# Patient Record
Sex: Male | Born: 1990 | Race: Black or African American | Hispanic: No | Marital: Single | Smoking: Never smoker
Health system: Southern US, Community
[De-identification: ages and names within clinical notes are randomized; demographics above are authoritative.]

---
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
## 2009-10-04 ENCOUNTER — Emergency Department (HOSPITAL_COMMUNITY): Admission: EM | Admit: 2009-10-04 | Discharge: 2009-10-04 | Payer: Self-pay | Admitting: Emergency Medicine

## 2010-02-28 ENCOUNTER — Emergency Department (HOSPITAL_COMMUNITY): Admission: EM | Admit: 2010-02-28 | Discharge: 2010-02-28 | Payer: Self-pay | Admitting: Emergency Medicine

## 2010-07-16 ENCOUNTER — Emergency Department (HOSPITAL_COMMUNITY): Admission: EM | Admit: 2010-07-16 | Discharge: 2010-07-16 | Payer: Self-pay | Admitting: Emergency Medicine

## 2011-02-22 LAB — RAPID STREP SCREEN (MED CTR MEBANE ONLY): Streptococcus, Group A Screen (Direct): NEGATIVE

## 2011-12-25 ENCOUNTER — Encounter (HOSPITAL_COMMUNITY): Payer: Self-pay | Admitting: *Deleted

## 2011-12-25 ENCOUNTER — Emergency Department (HOSPITAL_COMMUNITY)
Admission: EM | Admit: 2011-12-25 | Discharge: 2011-12-25 | Disposition: A | Discharge status: Home / Self Care | Payer: Self-pay | Attending: Emergency Medicine | Admitting: Emergency Medicine

## 2011-12-25 DIAGNOSIS — K5289 Other specified noninfective gastroenteritis and colitis: Secondary | ICD-10-CM | POA: Insufficient documentation

## 2011-12-25 DIAGNOSIS — R109 Unspecified abdominal pain: Secondary | ICD-10-CM | POA: Insufficient documentation

## 2011-12-25 DIAGNOSIS — R112 Nausea with vomiting, unspecified: Secondary | ICD-10-CM | POA: Insufficient documentation

## 2011-12-25 DIAGNOSIS — R197 Diarrhea, unspecified: Secondary | ICD-10-CM | POA: Insufficient documentation

## 2011-12-25 DIAGNOSIS — K529 Noninfective gastroenteritis and colitis, unspecified: Secondary | ICD-10-CM

## 2011-12-25 MED ORDER — DIPHENOXYLATE-ATROPINE 2.5-0.025 MG PO TABS: 1.0000 | ORAL_TABLET | Freq: Four times a day (QID) | ORAL | Status: AC | PRN

## 2011-12-25 MED ORDER — DIPHENOXYLATE-ATROPINE 2.5-0.025 MG PO TABS
2.0000 | ORAL_TABLET | Freq: Once | ORAL | Status: AC
  Administered 2011-12-25: 2 via ORAL
  Filled 2011-12-25: qty 2

## 2011-12-25 NOTE — ED Provider Notes (Signed)
History     CSN: [id number]  Arrival date & time 12/25/11  0341   First MD Initiated Contact with Patient 12/25/11 0400      Chief Complaint  Patient presents with  . GI Problem    (Consider location/radiation/quality/duration/timing/severity/associated sxs/prior treatment) HPI This is a 21 year old black male the history of nausea, vomiting and diarrhea that began yesterday. He states all of his friends have a similar illness. This is been accompanied by mild to moderate abdominal cramping. The nausea and vomiting have resolved and is now able to drink fluids without emesis. He states the diarrhea continues. He denies being lightheaded. He denies fever. He has not taken any medications for this.  History reviewed. No pertinent past medical history.  History reviewed. No pertinent past surgical history.  History reviewed. No pertinent family history.  History  Substance Use Topics  . Smoking status: Never Smoker   . Smokeless tobacco: Not on file  . Alcohol Use: No      Review of Systems  All other systems reviewed and are negative.    Allergies  Review of patient's allergies indicates no known allergies.  Home Medications  No current outpatient prescriptions on file.  BP 165/91  Pulse 68  Temp 98.2 F (36.8 C)  Resp 16  Ht 5\' 11"  (1.803 m)  Wt 270 lb (122.471 kg)  BMI 37.66 kg/m2  SpO2 98%  Physical Exam General: Well-developed, well-nourished male in no acute distress; appearance consistent with age of record HENT: normocephalic, atraumatic Eyes: pupils equal round and reactive to light; extraocular muscles intact Neck: supple Heart: regular rate and rhythm Lungs: clear to auscultation bilaterally Abdomen: soft; nondistended; slight diffuse tenderness; bowel sounds normal Extremities: No deformity; full range of motion; pulses normal Neurologic: Awake, alert and oriented; motor function intact in all extremities and symmetric; no facial droop Skin:  Warm and dry Psychiatric: Normal mood and affect    ED Course  Procedures (including critical care time)     MDM          Hanley Seamen, MD 12/25/11 0406

## 2011-12-25 NOTE — ED Notes (Signed)
Pt reporting generalized abdominal pain that began yesterday.  Reports nausea and vomiting x2. Reports some loose stools.  States he has been able to tolerate p.o intake and has been drinking Gatorade.

## 2012-05-27 ENCOUNTER — Encounter (HOSPITAL_COMMUNITY): Payer: Self-pay | Admitting: Emergency Medicine

## 2012-05-27 ENCOUNTER — Emergency Department (HOSPITAL_COMMUNITY)
Admission: EM | Admit: 2012-05-27 | Discharge: 2012-05-27 | Disposition: A | Discharge status: Home / Self Care | Payer: Self-pay | Attending: Emergency Medicine | Admitting: Emergency Medicine

## 2012-05-27 DIAGNOSIS — R112 Nausea with vomiting, unspecified: Secondary | ICD-10-CM | POA: Insufficient documentation

## 2012-05-27 DIAGNOSIS — I10 Essential (primary) hypertension: Secondary | ICD-10-CM | POA: Insufficient documentation

## 2012-05-27 NOTE — ED Notes (Signed)
Patient states he was at work and bought a honey bun from [employer] and when he bit into it there were "bugs and gnats in it." Patient states he vomited afterward.

## 2012-05-27 NOTE — Discharge Instructions (Signed)
Return to the emergency department if you're having any problems.  Your blood pressure was elevated today. He need to have the blood pressure rechecked in the next several days. If it is persistently elevated, he will need to start treatment to bring it down. It may be elevated just based on the stress of the incident tonight. Please make sure that she have her blood pressure checked again in the next week.  Hypertension Information As your heart beats, it forces blood through your arteries. This force is your blood pressure. If the pressure is too high, it is called hypertension (HTN) or high blood pressure. HTN is dangerous because you may have it and not know it. High blood pressure may mean that your heart has to work harder to pump blood. Your arteries may be narrow or stiff. The extra work puts you at risk for heart disease, stroke, and other problems.  Blood pressure consists of two numbers, a higher number over a lower, 110/72, for example. It is stated as "110 over 72." The ideal is below 120 for the top number (systolic) and under 80 for the bottom (diastolic).  You should pay close attention to your blood pressure if you have certain conditions such as:  Heart failure.   Prior heart attack.   Diabetes   Chronic kidney disease.   Prior stroke.   Multiple risk factors for heart disease.  To see if you have HTN, your blood pressure should be measured while you are seated with your arm held at the level of the heart. It should be measured at least twice. A one-time elevated blood pressure reading (especially in the Emergency Department) does not mean that you need treatment. There may be conditions in which the blood pressure is different between your right and left arms. It is important to see your caregiver soon for a recheck. Most people have essential hypertension which means that there is not a specific cause. This type of high blood pressure may be lowered by changing lifestyle  factors such as:  Stress.   Smoking.   Lack of exercise.   Excessive weight.   Drug/tobacco/alcohol use.   Eating less salt.  Most people do not have symptoms from high blood pressure until it has caused damage to the body. Effective treatment can often prevent, delay or reduce that damage. TREATMENT  Treatment for high blood pressure, when a cause has been identified, is directed at the cause. There are a large number of medications to treat HTN. These fall into several categories, and your caregiver will help you select the medicines that are best for you. Medications may have side effects. You should review side effects with your caregiver. If your blood pressure stays high after you have made lifestyle changes or started on medicines,   Your medication(s) may need to be changed.   Other problems may need to be addressed.   Be certain you understand your prescriptions, and know how and when to take your medicine.   Be sure to follow up with your caregiver within the time frame advised (usually within two weeks) to have your blood pressure rechecked and to review your medications.   If you are taking more than one medicine to lower your blood pressure, make sure you know how and at what times they should be taken. Taking two medicines at the same time can result in blood pressure that is too low.  Document Released: 01/28/2006 Document Revised: 08/07/2011 Document Reviewed: 02/04/2008 Exit[employer] Patient Information  [address] [place].

## 2012-05-27 NOTE — ED Provider Notes (Signed)
History     CSN: [id number]  Arrival date & time 05/27/12  0306   First MD Initiated Contact with Patient 05/27/12 0444      Chief Complaint  Patient presents with  . Emesis    (Consider location/radiation/quality/duration/timing/severity/associated sxs/prior treatment) HPI 21 year old male states that he was at work and he thought he had pastry from a vending machine and didn't notice that the wrap or was opened and when he did admit he noted that there were bugs and naps. He immediately got sick and vomited. There were some mild, crampy abdominal pain but all other systems resolved. He is worried about having been exposed to eating the bugs and just wanted to be checked out. At this point, it feels like he is back to normal. He denies any constipation diarrhea denies any ongoing abdominal pain. There's been no fever, chills, sweats. History reviewed. No pertinent past medical history.  History reviewed. No pertinent past surgical history.  History reviewed. No pertinent family history.  History  Substance Use Topics  . Smoking status: Never Smoker   . Smokeless tobacco: Not on file  . Alcohol Use: Yes     ocassionally      Review of Systems  Allergies  Review of patient's allergies indicates no known allergies.  Home Medications  No current outpatient prescriptions on file.  BP 179/92  Pulse 73  Temp 98.4 F (36.9 C) (Oral)  Resp 16  Ht 6' (1.829 m)  Wt 270 lb (122.471 kg)  BMI 36.62 kg/m2  SpO2 98%  Physical Exam 21 year old male who is resting comfortably and in no acute distress. Vital signs are significant for hypertension with blood pressure 179/92. Head is normocephalic and atraumatic. PERRLA, EOMI. Oropharynx is clear. Neck is nontender and supple. Back is nontender. Lungs are clear without rales, wheezes, rhonchi. Heart has regular rate rhythm without murmur. Abdomen is soft, flat, nontender without masses or hepatosplenomegaly. Extremities have full  range of motion, no cyanosis or edema. Skin is warm and dry without rash. Neurologic: Mental status is normal, cranial nerves are intact, there are no motor or sensory deficits. ED Course  Procedures (including critical care time)    1. Nausea and vomiting   2. Hypertension       MDM  Nausea and vomiting would certainly seem to be an emotional response to rise in that it probably eaten some insects. There is no indication for any testing at this point. However, it is noted that he has significant hypertension. This may be stress related but he will need to have his blood pressure reading checked in the next several days to make sure that it is not actually hypertension which needs treatment.        Dione Booze, MD 05/27/12 [phone]

## 2012-07-17 ENCOUNTER — Emergency Department (HOSPITAL_COMMUNITY): Payer: Self-pay

## 2012-07-17 ENCOUNTER — Encounter (HOSPITAL_COMMUNITY): Payer: Self-pay | Admitting: *Deleted

## 2012-07-17 ENCOUNTER — Emergency Department (HOSPITAL_COMMUNITY)
Admission: EM | Admit: 2012-07-17 | Discharge: 2012-07-18 | Disposition: A | Discharge status: Home / Self Care | Payer: Self-pay | Attending: Emergency Medicine | Admitting: Emergency Medicine

## 2012-07-17 DIAGNOSIS — J329 Chronic sinusitis, unspecified: Secondary | ICD-10-CM | POA: Insufficient documentation

## 2012-07-17 DIAGNOSIS — R000 Tachycardia, unspecified: Secondary | ICD-10-CM | POA: Insufficient documentation

## 2012-07-17 MED ORDER — ACETAMINOPHEN 500 MG PO TABS
ORAL_TABLET | ORAL | Status: AC
  Administered 2012-07-17: 1000 mg
  Filled 2012-07-17: qty 2

## 2012-07-17 MED ORDER — ACETAMINOPHEN 325 MG PO TABS
650.0000 mg | ORAL_TABLET | Freq: Once | ORAL | Status: DC
  Filled 2012-07-17: qty 2

## 2012-07-17 MED ORDER — AMOXICILLIN-POT CLAVULANATE 875-125 MG PO TABS
1.0000 | ORAL_TABLET | Freq: Once | ORAL | Status: AC
  Administered 2012-07-17: 1 via ORAL
  Filled 2012-07-17: qty 1

## 2012-07-17 NOTE — ED Provider Notes (Signed)
History     CSN: [id number]  Arrival date & time 07/17/12  2122   First MD Initiated Contact with Patient 07/17/12 2259      Chief Complaint  Patient presents with  . Fever  . Sinusitis    (Consider location/radiation/quality/duration/timing/severity/associated sxs/prior treatment) HPI Comments: Patient presents a one day of sinus congestion with rhinorrhea and cough. Is a fever to 102 with sore throat and facial pain. Endorses productive rhinorrhea or mucus production. Denies any nausea, vomiting, abdominal pain, chest pain or shortness of breath. His low back pain is chronic and unchanged from previous. No weakness, numbness, tingling, bowel or bladder incontinence.  The history is provided by the patient.    History reviewed. No pertinent past medical history.  History reviewed. No pertinent past surgical history.  History reviewed. No pertinent family history.  History  Substance Use Topics  . Smoking status: Never Smoker   . Smokeless tobacco: Not on file  . Alcohol Use: Yes     ocassionally      Review of Systems  Constitutional: Positive for fever, activity change, appetite change and fatigue.  HENT: Positive for congestion, sore throat, rhinorrhea and sinus pressure. Negative for neck pain and neck stiffness.   Respiratory: Negative for cough, chest tightness and shortness of breath.   Cardiovascular: Negative for chest pain.  Gastrointestinal: Negative for nausea, vomiting and abdominal pain.  Genitourinary: Negative for dysuria and hematuria.  Musculoskeletal: Positive for myalgias, back pain and arthralgias.  Skin: Negative for rash.  Neurological: Negative for dizziness and headaches.    Allergies  Review of patient's allergies indicates no known allergies.  Home Medications   Current Outpatient Rx  Name Route Sig Dispense Refill  . CHLORPHEN-PE-ACETAMINOPHEN 2-5-325 MG PO TABS Oral Take 2 tablets by mouth as needed. For pain    . AMOXICILLIN-POT  CLAVULANATE 875-125 MG PO TABS Oral Take 1 tablet by mouth every 12 (twelve) hours. 20 tablet 0    BP 150/79  Pulse 101  Temp 99.8 F (37.7 C) (Oral)  Resp 16  Ht 6' (1.829 m)  Wt 275 lb (124.739 kg)  BMI 37.30 kg/m2  SpO2 100%  Physical Exam  Constitutional: He is oriented to person, place, and time. He appears well-developed and well-nourished. No distress.  HENT:  Head: Normocephalic and atraumatic.  Mouth/Throat: Oropharynx is clear and moist. No oropharyngeal exudate.       Frontal and maxillary sinus tenderness  Eyes: Conjunctivae and EOM are normal. Pupils are equal, round, and reactive to light.  Neck: Normal range of motion.       No meningismus  Cardiovascular: Normal rate, regular rhythm and normal heart sounds.   No murmur heard.      tachycardic  Pulmonary/Chest: Effort normal and breath sounds normal. No respiratory distress.  Abdominal: Soft. There is no tenderness. There is no rebound and no guarding.  Musculoskeletal: Normal range of motion. He exhibits tenderness.       Paraspinal lumbar pain, no midline pain  Neurological: He is alert and oriented to person, place, and time. No cranial nerve deficit.       5 Out of 5 strength throughout, cranial nerves 2-12 intact, no facial asymmetry  Skin: Skin is warm.    ED Course  Procedures (including critical care time)   Labs Reviewed  RAPID STREP SCREEN   Dg Chest 2 View  07/18/2012  *RADIOLOGY REPORT*  Clinical Data: Congestion  CHEST - 2 VIEW  Comparison: None.  Findings: Normal mediastinum  and cardiac silhouette.  Normal pulmonary  vasculature.  No evidence of effusion, infiltrate, or pneumothorax.  No acute bony abnormality.  IMPRESSION: Normal chest radiograph.  Original Report Authenticated By: Genevive Bi, M.D.     1. Sinusitis       MDM  Fever, sinus congestion, rhinorrhea and cough. Febrile tachycardic. No distress.  Rapid strep negative, CXR negative. Well appearing, nontoxic. Treat for  sinusitis based on sinus tenderness, fever, rhinorrhea.       Glynn Octave, MD 07/18/12 [phone]

## 2012-07-17 NOTE — ED Notes (Signed)
Onset today of sinus congestion.  Back pain also

## 2012-07-18 MED ORDER — AMOXICILLIN-POT CLAVULANATE 875-125 MG PO TABS: 1.0000 | ORAL_TABLET | Freq: Two times a day (BID) | ORAL | Status: AC

## 2012-11-24 ENCOUNTER — Emergency Department (HOSPITAL_COMMUNITY)
Admission: EM | Admit: 2012-11-24 | Discharge: 2012-11-24 | Disposition: A | Discharge status: Home / Self Care | Payer: Self-pay | Attending: Emergency Medicine | Admitting: Emergency Medicine

## 2012-11-24 ENCOUNTER — Emergency Department (HOSPITAL_COMMUNITY): Payer: Self-pay

## 2012-11-24 ENCOUNTER — Encounter (HOSPITAL_COMMUNITY): Payer: Self-pay

## 2012-11-24 DIAGNOSIS — R059 Cough, unspecified: Secondary | ICD-10-CM | POA: Insufficient documentation

## 2012-11-24 DIAGNOSIS — B349 Viral infection, unspecified: Secondary | ICD-10-CM

## 2012-11-24 DIAGNOSIS — R05 Cough: Secondary | ICD-10-CM | POA: Insufficient documentation

## 2012-11-24 DIAGNOSIS — R51 Headache: Secondary | ICD-10-CM | POA: Insufficient documentation

## 2012-11-24 DIAGNOSIS — R1110 Vomiting, unspecified: Secondary | ICD-10-CM | POA: Insufficient documentation

## 2012-11-24 DIAGNOSIS — R509 Fever, unspecified: Secondary | ICD-10-CM | POA: Insufficient documentation

## 2012-11-24 DIAGNOSIS — J3489 Other specified disorders of nose and nasal sinuses: Secondary | ICD-10-CM | POA: Insufficient documentation

## 2012-11-24 DIAGNOSIS — R6889 Other general symptoms and signs: Secondary | ICD-10-CM | POA: Insufficient documentation

## 2012-11-24 DIAGNOSIS — IMO0001 Reserved for inherently not codable concepts without codable children: Secondary | ICD-10-CM | POA: Insufficient documentation

## 2012-11-24 DIAGNOSIS — B9789 Other viral agents as the cause of diseases classified elsewhere: Secondary | ICD-10-CM | POA: Insufficient documentation

## 2012-11-24 DIAGNOSIS — R52 Pain, unspecified: Secondary | ICD-10-CM | POA: Insufficient documentation

## 2012-11-24 LAB — CBC WITH DIFFERENTIAL/PLATELET
Basophils Absolute: 0 10*3/uL (ref 0.0–0.1)
HCT: 40.5 % (ref 39.0–52.0)
Hemoglobin: 13.6 g/dL (ref 13.0–17.0)
Lymphocytes Relative: 8 % — ABNORMAL LOW (ref 12–46)
Monocytes Absolute: 0.6 10*3/uL (ref 0.1–1.0)
Neutro Abs: 7.7 10*3/uL (ref 1.7–7.7)
Neutrophils Relative %: 85 % — ABNORMAL HIGH (ref 43–77)
RDW: 12.7 % (ref 11.5–15.5)
WBC: 9 10*3/uL (ref 4.0–10.5)

## 2012-11-24 LAB — COMPREHENSIVE METABOLIC PANEL
ALT: 20 U/L (ref 0–53)
AST: 17 U/L (ref 0–37)
Albumin: 4.5 g/dL (ref 3.5–5.2)
Alkaline Phosphatase: 67 U/L (ref 39–117)
CO2: 28 mEq/L (ref 19–32)
Chloride: 99 mEq/L (ref 96–112)
Creatinine, Ser: 1.19 mg/dL (ref 0.50–1.35)
GFR calc non Af Amer: 85 mL/min — ABNORMAL LOW (ref >90)
Potassium: 3.6 mEq/L (ref 3.5–5.1)
Total Bilirubin: 0.6 mg/dL (ref 0.3–1.2)

## 2012-11-24 LAB — URINALYSIS, MICROSCOPIC ONLY
Bilirubin Urine: NEGATIVE
Specific Gravity, Urine: 1.02 (ref 1.005–1.030)
pH: 6 (ref 5.0–8.0)

## 2012-11-24 MED ORDER — IBUPROFEN 800 MG PO TABS: 800.0000 mg | ORAL_TABLET | Freq: Three times a day (TID) | ORAL | Status: DC

## 2012-11-24 MED ORDER — IBUPROFEN 800 MG PO TABS
800.0000 mg | ORAL_TABLET | Freq: Once | ORAL | Status: AC
  Administered 2012-11-24: 800 mg via ORAL
  Filled 2012-11-24: qty 1

## 2012-11-24 MED ORDER — ONDANSETRON HCL 4 MG/2ML IJ SOLN: 4.0000 mg | Freq: Once | INTRAMUSCULAR | Status: DC

## 2012-11-24 MED ORDER — GUAIFENESIN-CODEINE 100-10 MG/5ML PO SYRP: 10.0000 mL | ORAL_SOLUTION | Freq: Three times a day (TID) | ORAL | Status: AC | PRN

## 2012-11-24 MED ORDER — SODIUM CHLORIDE 0.9 % IV SOLN: 1000.0000 mL | Freq: Once | INTRAVENOUS | Status: DC

## 2012-11-24 MED ORDER — ACETAMINOPHEN 500 MG PO TABS
1000.0000 mg | ORAL_TABLET | Freq: Once | ORAL | Status: AC
  Administered 2012-11-24: 1000 mg via ORAL
  Filled 2012-11-24: qty 2

## 2012-11-24 NOTE — ED Provider Notes (Signed)
History     CSN: [id number]  Arrival date & time 11/24/12  1205   First MD Initiated Contact with Patient 11/24/12 1407      Chief Complaint  Patient presents with  . Influenza    (Consider location/radiation/quality/duration/timing/severity/associated sxs/prior treatment) HPI Comments: Patient c/o sudden onset of generalized body aches, fever, chills, sweats, nasal congestion and cough that began 2-3 days ago.  states he had vomiting on the day prior to ED arrival but has since resolved.  He also c/o frontal headache that has been intermittent since onset of the fever.  He denies known influenza exposure, chest pain , shortness of breath, abdominal pain, visual changes or neck pain/stiffness    Patient is a 21 y.o. male presenting with flu symptoms. The history is provided by the patient.  Influenza This is a new problem. The current episode started in the past 7 days. The problem occurs constantly. The problem has been unchanged. Associated symptoms include chills, congestion, coughing, a fever, headaches, myalgias and vomiting. Pertinent negatives include no abdominal pain, anorexia, arthralgias, chest pain, joint swelling, nausea, neck pain, numbness, rash, sore throat, swollen glands, vertigo, visual change or weakness. Nothing aggravates the symptoms. He has tried nothing for the symptoms. The treatment provided no relief.    History reviewed. No pertinent past medical history.  History reviewed. No pertinent past surgical history.  No family history on file.  History  Substance Use Topics  . Smoking status: Never Smoker   . Smokeless tobacco: Not on file  . Alcohol Use: Yes     Comment: ocassionally      Review of Systems  Constitutional: Positive for fever and chills. Negative for activity change and appetite change.  HENT: Positive for congestion, rhinorrhea and sneezing. Negative for ear pain, sore throat, trouble swallowing, neck pain and neck stiffness.   Eyes:  Negative for visual disturbance.  Respiratory: Positive for cough. Negative for chest tightness and shortness of breath.   Cardiovascular: Negative for chest pain.  Gastrointestinal: Positive for vomiting. Negative for nausea, abdominal pain and anorexia.  Genitourinary: Negative for dysuria and flank pain.  Musculoskeletal: Positive for myalgias. Negative for joint swelling and arthralgias.  Skin: Negative for color change and rash.  Neurological: Positive for headaches. Negative for dizziness, vertigo, weakness and numbness.  All other systems reviewed and are negative.    Allergies  Review of patient's allergies indicates no known allergies.  Home Medications   Current Outpatient Rx  Name  Route  Sig  Dispense  Refill  . TETRAHYDROZOLINE HCL 0.05 % OP SOLN   Both Eyes   Place 1 drop into both eyes daily as needed. Dry eye relief (Work Related)           BP 142/64  Pulse 105  Temp 102.9 F (39.4 C) (Oral)  Resp 22  Ht 5\' 11"  (1.803 m)  Wt 270 lb (122.471 kg)  BMI 37.66 kg/m2  SpO2 100%  Physical Exam  Nursing note and vitals reviewed. Constitutional: He is oriented to person, place, and time. He appears well-developed and well-nourished. No distress.  HENT:  Head: Normocephalic and atraumatic.  Right Ear: Tympanic membrane and ear canal normal.  Left Ear: Tympanic membrane and ear canal normal.  Nose: Mucosal edema and rhinorrhea present.  Mouth/Throat: Uvula is midline and mucous membranes are normal. Posterior oropharyngeal erythema present. No oropharyngeal exudate, posterior oropharyngeal edema or tonsillar abscesses.  Eyes: EOM are normal. Pupils are equal, round, and reactive to light.  Neck:  Normal range of motion and phonation normal. Neck supple. No spinous process tenderness and no muscular tenderness present. No Brudzinski's sign and no Kernig's sign noted.  Cardiovascular: Normal rate, regular rhythm, normal heart sounds and intact distal pulses.   No  murmur heard. Pulmonary/Chest: Effort normal and breath sounds normal. No respiratory distress. He has no wheezes. He has no rales.  Abdominal: Soft. He exhibits no distension. There is no tenderness. There is no rebound.  Musculoskeletal: Normal range of motion. He exhibits no edema.  Lymphadenopathy:    He has no cervical adenopathy.  Neurological: He is alert and oriented to person, place, and time. Coordination normal.  Skin: Skin is warm and dry.    ED Course  Procedures (including critical care time)  Labs Reviewed  URINALYSIS, MICROSCOPIC ONLY - Abnormal; Notable for the following:    Hgb urine dipstick TRACE (*)     Protein, ur 30 (*)     All other components within normal limits  CBC WITH DIFFERENTIAL - Abnormal; Notable for the following:    Neutrophils Relative 85 (*)     Lymphocytes Relative 8 (*)     All other components within normal limits  COMPREHENSIVE METABOLIC PANEL - Abnormal; Notable for the following:    Glucose, Bld 114 (*)     Total Protein 8.5 (*)     GFR calc non Af Amer 85 (*)     All other components within normal limits   Dg Chest 2 View  11/24/2012  *RADIOLOGY REPORT*  Clinical Data: Cough and congestion; fever  CHEST - 2 VIEW  Comparison: July 18, 2012  Findings: Lungs clear.  Heart size and pulmonary vascularity are normal.  No adenopathy.  No bone lesions.  IMPRESSION: Lungs clear.   Original Report Authenticated By: Bretta Bang, M.D.         MDM    Patient is non-toxic appearing, no meningeal signs.  Vitals improved.  Has drank several cans of soda and water.  Sx's are likely related to viral illness.  Will treat symptomatically.  Pt agrees to f/u with his PMD or return here if sx's worsen   Prescribed: Robitussin AC Ibuprofen 800mg      Gable Odonohue L. [place], [place] 11/25/12 2105

## 2012-11-24 NOTE — ED Notes (Signed)
Headache, n/v and aching

## 2012-11-24 NOTE — ED Notes (Signed)
Patient with no complaints at this time. Respirations even and unlabored. Skin warm/dry. Discharge instructions reviewed with patient at this time. Patient given opportunity to voice concerns/ask questions. Patient discharged at this time and left Emergency Department with steady gait.   

## 2012-11-26 NOTE — ED Provider Notes (Signed)
Medical screening examination/treatment/procedure(s) were performed by non-physician practitioner and as supervising physician I was immediately available for consultation/collaboration.  Danton Palmateer, MD 11/26/12 0752 

## 2012-12-16 ENCOUNTER — Emergency Department (HOSPITAL_COMMUNITY)
Admission: EM | Admit: 2012-12-16 | Discharge: 2012-12-16 | Disposition: A | Discharge status: Home / Self Care | Payer: Self-pay | Attending: Emergency Medicine | Admitting: Emergency Medicine

## 2012-12-16 ENCOUNTER — Encounter (HOSPITAL_COMMUNITY): Payer: Self-pay

## 2012-12-16 DIAGNOSIS — L089 Local infection of the skin and subcutaneous tissue, unspecified: Secondary | ICD-10-CM | POA: Insufficient documentation

## 2012-12-16 MED ORDER — KETOROLAC TROMETHAMINE 10 MG PO TABS
10.0000 mg | ORAL_TABLET | Freq: Once | ORAL | Status: AC
  Administered 2012-12-16: 10 mg via ORAL
  Filled 2012-12-16: qty 1

## 2012-12-16 MED ORDER — HYDROCODONE-ACETAMINOPHEN 5-325 MG PO TABS
2.0000 | ORAL_TABLET | Freq: Once | ORAL | Status: AC
  Administered 2012-12-16: 2 via ORAL
  Filled 2012-12-16: qty 2

## 2012-12-16 MED ORDER — LIDOCAINE HCL (PF) 1 % IJ SOLN
INTRAMUSCULAR | Status: AC
  Administered 2012-12-16: 11:00:00 via INTRAMUSCULAR
  Filled 2012-12-16: qty 5

## 2012-12-16 MED ORDER — HYDROCODONE-ACETAMINOPHEN 5-325 MG PO TABS: ORAL_TABLET | ORAL | Status: DC

## 2012-12-16 MED ORDER — SULFAMETHOXAZOLE-TMP DS 800-160 MG PO TABS
1.0000 | ORAL_TABLET | Freq: Once | ORAL | Status: AC
  Administered 2012-12-16: 1 via ORAL
  Filled 2012-12-16: qty 1

## 2012-12-16 MED ORDER — AMOXICILLIN 500 MG PO CAPS: ORAL_CAPSULE | ORAL | Status: DC

## 2012-12-16 MED ORDER — CEFTRIAXONE SODIUM 1 G IJ SOLR
1.0000 g | Freq: Once | INTRAMUSCULAR | Status: AC
  Administered 2012-12-16: 1 g via INTRAMUSCULAR
  Filled 2012-12-16: qty 10

## 2012-12-16 MED ORDER — SULFAMETHOXAZOLE-TRIMETHOPRIM 800-160 MG PO TABS: 1.0000 | ORAL_TABLET | Freq: Two times a day (BID) | ORAL | Status: DC

## 2012-12-16 MED ORDER — ONDANSETRON HCL 4 MG PO TABS
4.0000 mg | ORAL_TABLET | Freq: Once | ORAL | Status: AC
  Administered 2012-12-16: 4 mg via ORAL
  Filled 2012-12-16: qty 1

## 2012-12-16 NOTE — ED Notes (Signed)
Pt c/o toothache and knot under skin under r nostril x 3 days.

## 2012-12-16 NOTE — ED Provider Notes (Signed)
Medical screening examination/treatment/procedure(s) were performed by non-physician practitioner and as supervising physician I was immediately available for consultation/collaboration.   Glynn Freas L Gracelin Weisberg, MD 12/16/12 1524 

## 2012-12-16 NOTE — ED Provider Notes (Signed)
History     CSN: [id number]  Arrival date & time 12/16/12  0908   First MD Initiated Contact with Patient 12/16/12 1004      Chief Complaint  Patient presents with  . Abscess    (Consider location/radiation/quality/duration/timing/severity/associated sxs/prior treatment) HPI Comments: Patient presents with a three-day history of swelling and discomfort of the upper lip, and swelling of the front teeth area. He has not had any injury or trauma to the area. The patient states that he feels a" knot" in his upper lip extending just under the right nostril. The patient has not had any recent fever. He was treated approximately a week ago for influenza but no other symptoms at this time. He presents for evaluation and management of this problem as the pain is getting progressively worse.  Patient is a 22 y.o. male presenting with abscess. The history is provided by the patient.  Abscess  This is a new problem. The current episode started less than one week ago. The problem occurs frequently. The problem has been gradually worsening. The abscess is present on the lips. The problem is moderate. The abscess is characterized by painfulness. It is unknown what he was exposed to. Pertinent negatives include no anorexia, no fever, no vomiting and no cough. There were no sick contacts. Recently, medical care has been given at this facility. Services Performed: Pt recently treated for influenza.    History reviewed. No pertinent past medical history.  History reviewed. No pertinent past surgical history.  No family history on file.  History  Substance Use Topics  . Smoking status: Never Smoker   . Smokeless tobacco: Not on file  . Alcohol Use: No     Comment: ocassionally      Review of Systems  Constitutional: Negative for fever and activity change.       All ROS Neg except as noted in HPI  HENT: Positive for facial swelling and dental problem. Negative for nosebleeds and neck pain.     Eyes: Negative for photophobia and discharge.  Respiratory: Negative for cough, shortness of breath and wheezing.   Cardiovascular: Negative for chest pain and palpitations.  Gastrointestinal: Negative for vomiting, abdominal pain, blood in stool and anorexia.  Genitourinary: Negative for dysuria, frequency and hematuria.  Musculoskeletal: Negative for back pain and arthralgias.  Skin: Negative.        abscess  Neurological: Negative for dizziness, seizures and speech difficulty.  Psychiatric/Behavioral: Negative for hallucinations and confusion.    Allergies  Review of patient's allergies indicates no known allergies.  Home Medications   Current Outpatient Rx  Name  Route  Sig  Dispense  Refill  . IBUPROFEN 800 MG PO TABS   Oral   Take 1 tablet (800 mg total) by mouth 3 (three) times daily.   21 tablet   0   . TETRAHYDROZOLINE HCL 0.05 % OP SOLN   Both Eyes   Place 1 drop into both eyes daily as needed. Dry eye relief (Work Related)         . AMOXICILLIN 500 MG PO CAPS      2 PO BID WITH FOOD   28 capsule   0   . HYDROCODONE-ACETAMINOPHEN 5-325 MG PO TABS      1 or 2 po q4h prn pain   15 tablet   0   . SULFAMETHOXAZOLE-TRIMETHOPRIM 800-160 MG PO TABS   Oral   Take 1 tablet by mouth every 12 (twelve) hours.   14 tablet  0     BP 146/76  Pulse 89  Temp 98.8 F (37.1 C) (Oral)  Resp 16  Ht 6' (1.829 m)  Wt 280 lb (127.007 kg)  BMI 37.97 kg/m2  SpO2 98%  Physical Exam  Nursing note and vitals reviewed. Constitutional: He is oriented to person, place, and time. He appears well-developed and well-nourished.  Non-toxic appearance.  HENT:  Head: Normocephalic.  Right Ear: Tympanic membrane and external ear normal.  Left Ear: Tympanic membrane and external ear normal.       Patient has a small whitehead at the opening of the right nostril. Sore to palpation. The upper lip on the right is swollen. There is no drainage appreciated.  The buccal mucosa of  the upper lip, as well as the gum and frenulum are swollen. There is no visible abscess involving the gum. The airway is patent. There is no swelling under the tongue.  Eyes: EOM and lids are normal. Pupils are equal, round, and reactive to light.  Neck: Normal range of motion. Neck supple. Carotid bruit is not present.  Cardiovascular: Normal rate, regular rhythm, normal heart sounds, intact distal pulses and normal pulses.   Pulmonary/Chest: Breath sounds normal. No respiratory distress.  Abdominal: Soft. Bowel sounds are normal. There is no tenderness. There is no guarding.  Musculoskeletal: Normal range of motion.  Lymphadenopathy:       Head (right side): No submandibular adenopathy present.       Head (left side): No submandibular adenopathy present.    He has no cervical adenopathy.  Neurological: He is alert and oriented to person, place, and time. He has normal strength. No cranial nerve deficit or sensory deficit.  Skin: Skin is warm and dry.  Psychiatric: He has a normal mood and affect. His speech is normal.    ED Course  Procedures (including critical care time)  Labs Reviewed - No data to display No results found.   1. Infection, skin       MDM  I have reviewed nursing notes, vital signs, and all appropriate lab and imaging results for this patient. Patient has an infected area of the right upper lip involving the right nostril. There is also swelling of the upper gums, and frenulum. Patient treated in the emergency department with Rocephin and Septra. Prescription given for Amoxil and Septra. The patient also given a prescription for Norco 5 mg every 4 hours as needed for pain #15 tablets. Patient is to use a warm compress to the area. He is to return if not improving.       Kathie Dike, [place] 12/16/12 1215

## 2013-06-10 ENCOUNTER — Emergency Department (HOSPITAL_COMMUNITY)
Admission: EM | Admit: 2013-06-10 | Discharge: 2013-06-10 | Disposition: A | Discharge status: Home / Self Care | Payer: 59 | Attending: Emergency Medicine | Admitting: Emergency Medicine

## 2013-06-10 ENCOUNTER — Encounter (HOSPITAL_COMMUNITY): Payer: Self-pay

## 2013-06-10 DIAGNOSIS — M549 Dorsalgia, unspecified: Secondary | ICD-10-CM | POA: Insufficient documentation

## 2013-06-10 DIAGNOSIS — R197 Diarrhea, unspecified: Secondary | ICD-10-CM | POA: Insufficient documentation

## 2013-06-10 DIAGNOSIS — R350 Frequency of micturition: Secondary | ICD-10-CM

## 2013-06-10 LAB — URINALYSIS, ROUTINE W REFLEX MICROSCOPIC
Bilirubin Urine: NEGATIVE
Glucose, UA: NEGATIVE mg/dL
Hgb urine dipstick: NEGATIVE
Ketones, ur: NEGATIVE mg/dL
Protein, ur: NEGATIVE mg/dL
Urobilinogen, UA: 1 mg/dL (ref 0.0–1.0)

## 2013-06-10 MED ORDER — ACETAMINOPHEN 325 MG PO TABS
650.0000 mg | ORAL_TABLET | Freq: Once | ORAL | Status: DC
  Filled 2013-06-10: qty 2

## 2013-06-10 MED ORDER — ONDANSETRON 8 MG PO TBDP
8.0000 mg | ORAL_TABLET | Freq: Once | ORAL | Status: DC
  Filled 2013-06-10: qty 1

## 2013-06-10 NOTE — ED Provider Notes (Signed)
   History    CSN: [id number] Arrival date & time 06/10/13  0014  First MD Initiated Contact with Patient 06/10/13 0058     Chief Complaint  Patient presents with  . Back Pain  . Abdominal Pain    Patient is a 22 y.o. male presenting with diarrhea. The history is provided by the patient.  Diarrhea Quality:  Watery Onset quality:  Gradual Duration:  1 week Timing:  Intermittent Progression:  Unchanged Relieved by:  Nothing Worsened by:  Nothing tried Associated symptoms: no abdominal pain, no fever, no headaches and no vomiting    Pt here for multiple complaints He reports recent nonbloody loose stool with some diarrhea No vomiting He denies abdominal pain on my evaluation He also reports some urinary urgency but no dysuria and penile discharge No fever He mentions brief episodes of back/flank pain at this time  He has no focal weakness He does not want any medications here   PMH - none  History  Substance Use Topics  . Smoking status: Never Smoker   . Smokeless tobacco: Not on file  . Alcohol Use: No     Comment: ocassionally    Review of Systems  Constitutional: Negative for fever.  Respiratory: Negative for shortness of breath.   Cardiovascular: Negative for chest pain.  Gastrointestinal: Positive for diarrhea. Negative for vomiting, abdominal pain and anal bleeding.  Genitourinary: Positive for urgency.  Musculoskeletal: Positive for back pain.  Neurological: Negative for weakness and headaches.  Psychiatric/Behavioral: Negative for agitation.  All other systems reviewed and are negative.    Allergies  Review of patient's allergies indicates no known allergies.  Home Medications  No current outpatient prescriptions on file. BP 140/84  Pulse 75  Temp(Src) 98.6 F (37 C) (Oral)  Resp 16  Ht 6' (1.829 m)  Wt 280 lb (127.007 kg)  BMI 37.97 kg/m2  SpO2 96% Physical Exam CONSTITUTIONAL: Well developed/well nourished HEAD:  Normocephalic/atraumatic EYES: EOMI/PERRL ENMT: Mucous membranes moist NECK: supple no meningeal signs SPINE:entire spine nontender CV: S1/S2 noted, no murmurs/rubs/gallops noted LUNGS: Lungs are clear to auscultation bilaterally, no apparent distress ABDOMEN: soft, nontender, no rebound or guarding GU:no cva tenderness, no penile discharge, no scrotal tenderness noted.  No penile lesions noted.   NEURO: Pt is awake/alert, moves all extremitiesx4, smiling, no distress, walking around room without difficulty EXTREMITIES: pulses normal, full ROM SKIN: warm, color normal PSYCH: no abnormalities of mood noted  ED Course  Procedures Labs Reviewed  URINALYSIS, ROUTINE W REFLEX MICROSCOPIC - Abnormal; Notable for the following:    Specific Gravity, Urine >1.030 (*)    All other components within normal limits  GLUCOSE, CAPILLARY   No results found. 1. Urinary frequency   2. Diarrhea     MDM  Nursing notes including past medical history and social history reviewed and considered in documentation Labs/vital reviewed and considered   Pt admits he searched for these symptoms on the internet and he became nervous and wanted to be evaluated He now feels well and would like to be discharged Pt stable for d/c  Joya Gaskins, MD 06/10/13 0309

## 2013-06-10 NOTE — ED Notes (Signed)
Patient left prior to nursing discharge instructions but after MD spoke with him about discharge.

## 2013-06-10 NOTE — ED Notes (Signed)
Went to administer tylenol and zofran odt to patient, patient stated, "I'm not in any pain or nasueated at the moment, I don't need to take any medication right now."

## 2013-06-10 NOTE — ED Notes (Signed)
Low back pain, stomach pain, having a low of gas, and my stool is soft per pt.

## 2014-04-21 ENCOUNTER — Emergency Department (HOSPITAL_COMMUNITY): Payer: 59

## 2014-04-21 ENCOUNTER — Encounter (HOSPITAL_COMMUNITY): Payer: Self-pay | Admitting: Emergency Medicine

## 2014-04-21 ENCOUNTER — Emergency Department (HOSPITAL_COMMUNITY)
Admission: EM | Admit: 2014-04-21 | Discharge: 2014-04-21 | Disposition: A | Discharge status: Home / Self Care | Payer: 59 | Attending: Emergency Medicine | Admitting: Emergency Medicine

## 2014-04-21 DIAGNOSIS — S62329A Displaced fracture of shaft of unspecified metacarpal bone, initial encounter for closed fracture: Secondary | ICD-10-CM | POA: Insufficient documentation

## 2014-04-21 DIAGNOSIS — S62306A Unspecified fracture of fifth metacarpal bone, right hand, initial encounter for closed fracture: Secondary | ICD-10-CM

## 2014-04-21 MED ORDER — HYDROCODONE-ACETAMINOPHEN 7.5-325 MG PO TABS: 1.0000 | ORAL_TABLET | ORAL | Status: DC | PRN
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
## 2014-04-21 NOTE — Discharge Instructions (Signed)
Your x-ray reveals that the bone behind your fifth finger on the right hand is broken and more than one place. Please apply ice and keep it elevated above your heart. Please see Dr. [name] on Monday may 18. Use Tylenol or ibuprofen for mild pain, use Norco for more severe pain. This medication may cause drowsiness,  Hand Fracture, Fifth Metacarpal The small metacarpal is the bone at the base of the little finger between the knuckle and the wrist. A fracture is a break in that bone. One of the fractures that is common to this bone is called a Boxer's Fracture. TREATMENT These fractures can be treated with:   Reduction (bones moved back into place), then pinned through the skin to maintain the position, and then casted for about 6 weeks or as your caregiver determines necessary.  ORIF (open reduction and internal fixation) - the fracture site is opened and the bone pieces are fixed into place with pins and then casted for approximately 6 weeks or as your caregiver determines necessary. Your caregiver will discuss the type of fracture you have and the treatment that should be best for that problem. If surgery is the treatment of choice, the following is information for you to know, and also let your caregiver know about prior to surgery.  LET YOUR CAREGIVER KNOW ABOUT:  Allergies.  Medications taken including herbs, eye drops, over the counter medications, and creams.  Use of steroids (by mouth or creams).  Previous problems with anesthetics or novocaine.  Possibility of pregnancy, if this applies.  History of blood clots (thrombophlebitis).  History of bleeding or blood problems.  Previous surgery.  Other health problems. AFTER THE PROCEDURE After surgery, you will be taken to the recovery area where a nurse will watch and check your progress. Once you're awake, stable, and taking fluids well, barring other problems you'll be allowed to go home. Once home an ice pack applied to your  operative site may help with discomfort and keep the swelling down. HOME CARE INSTRUCTIONS   Follow your caregiver's instructions as to activities, exercises, physical therapy, and driving a car.  Daily exercise is helpful for maintaining range of motion (movement and mobility) and strength. Exercise as instructed.  To lessen swelling, keep the injured hand elevated above the level of your heart as much as possible.  Apply ice to the injury for 15-20 minutes each hour while awake for the first 2 days. Put the ice in a plastic bag and place a thin towel between the bag of ice and your cast.  Move the fingers of your casted hand at least several times a day.  If a plaster or fiberglass cast was applied:  Do not try to scratch the skin under the cast using a sharp or pointed object.  Check the skin around the cast every day. You may put lotion on red or sore areas.  Keep your cast dry. Your cast can be protected during bathing with a plastic bag. Do not put your cast into the water.  If a plaster splint was applied:  Wear the splint for as long as directed by your caregiver or until seen for follow-up examination.  Do not get your splint wet. Protect it during bathing with a plastic bag.  You may loosen the elastic bandage around the splint if your fingers start to get numb, tingle, get cold or turn blue.  Do not put pressure on your cast or splint; this may cause it to break. Especially,  do not lean plaster casts on hard surfaces for 24 hours after application.  Take medications as directed by your caregiver.  Only take over-the-counter or prescription medicines for pain, discomfort, or fever as directed by your caregiver.  Follow all instructions for physician referrals, physical therapy, and rehabilitation. Any delay in obtaining necessary care could result in permanent injury, disability and chronic pain. SEEK MEDICAL CARE IF:   Increased bleeding (more than a small spot) from  the wound or from beneath your cast or splint if there is a wound beneath the cast from surgery.  Redness, swelling, or increasing pain in the wound or from beneath your cast or splint.  Pus coming from wound or from beneath your cast or splint.  An unexplained oral temperature above 102 F (38.9 C) develops.  A foul smell coming from the wound or dressing or from beneath your cast or splint.  You are unable to move your little finger. SEEK IMMEDIATE MEDICAL CARE IF:  You develop a rash, have difficulty breathing, or have any allergy problems. If you do not have a window in your cast for observing the wound, a discharge or minor bleeding may show up as a stain on the outside of your cast. Report these findings to your caregiver. MAKE SURE YOU:   Understand these instructions.  Will watch your condition.  Will get help right away if you are not doing well or get worse. Document Released: 03/03/2001 Document Revised: 02/17/2012 Document Reviewed: 07/14/2008 [facility] Patient Information 2014 [employer], [place]. Please use with caution.

## 2014-04-25 NOTE — ED Provider Notes (Signed)
Medical screening examination/treatment/procedure(s) were performed by non-physician practitioner and as supervising physician I was immediately available for consultation/collaboration.     Geoffery Lyonsouglas Harlin Mazzoni, MD 04/25/14 [phone]

## 2014-06-08 ENCOUNTER — Encounter (INDEPENDENT_AMBULATORY_CARE_PROVIDER_SITE_OTHER): Payer: Self-pay

## 2014-06-08 ENCOUNTER — Ambulatory Visit (HOSPITAL_COMMUNITY)
Admission: RE | Admit: 2014-06-08 | Discharge: 2014-06-08 | Disposition: A | Discharge status: Home / Self Care | Payer: 59 | Source: Ambulatory Visit | Attending: Orthopaedic Surgery | Admitting: Orthopaedic Surgery

## 2014-06-08 ENCOUNTER — Other Ambulatory Visit (HOSPITAL_COMMUNITY): Payer: Self-pay | Admitting: Orthopaedic Surgery

## 2014-06-08 DIAGNOSIS — S62309A Unspecified fracture of unspecified metacarpal bone, initial encounter for closed fracture: Secondary | ICD-10-CM

## 2014-06-08 DIAGNOSIS — Z4789 Encounter for other orthopedic aftercare: Secondary | ICD-10-CM | POA: Insufficient documentation

## 2014-12-16 ENCOUNTER — Emergency Department (HOSPITAL_COMMUNITY)
Admission: EM | Admit: 2014-12-16 | Discharge: 2014-12-16 | Disposition: A | Discharge status: Home / Self Care | Payer: BLUE CROSS/BLUE SHIELD | Attending: Emergency Medicine | Admitting: Emergency Medicine

## 2014-12-16 ENCOUNTER — Encounter (HOSPITAL_COMMUNITY): Payer: Self-pay | Admitting: Emergency Medicine

## 2014-12-16 DIAGNOSIS — R197 Diarrhea, unspecified: Secondary | ICD-10-CM | POA: Diagnosis not present

## 2014-12-16 DIAGNOSIS — B349 Viral infection, unspecified: Secondary | ICD-10-CM | POA: Diagnosis not present

## 2014-12-16 DIAGNOSIS — M791 Myalgia, unspecified site: Secondary | ICD-10-CM

## 2014-12-16 DIAGNOSIS — R52 Pain, unspecified: Secondary | ICD-10-CM | POA: Diagnosis present

## 2014-12-16 MED ORDER — ONDANSETRON 8 MG PO TBDP
8.0000 mg | ORAL_TABLET | Freq: Once | ORAL | Status: AC
  Administered 2014-12-16: 8 mg via ORAL
  Filled 2014-12-16: qty 1

## 2014-12-16 NOTE — ED Notes (Signed)
Pt reports generalized body aches, diarrhea,chills since yesterday. nad noted.

## 2014-12-16 NOTE — Care Management Note (Signed)
ED/CM noted patient did not have health insurance and/or PCP listed in the computer.  Patient was given the [place] resource handout with information on the clinics, food pantries, and the handout for new health insurance sign-up.  Patient expressed appreciation for information received. 

## 2014-12-16 NOTE — ED Provider Notes (Signed)
CSN: [id number]     Arrival date & time 12/16/14  1404 History   First MD Initiated Contact with Patient 12/16/14 1413     Chief Complaint  Patient presents with  . Generalized Body Aches     Patient is a 24 y.o. male presenting with diarrhea. The history is provided by the patient.  Diarrhea Quality:  Watery Severity:  Moderate Onset quality:  Gradual Number of episodes:  2-3 per day Duration:  7 days Timing:  Intermittent Progression:  Unchanged Relieved by:  Nothing Worsened by:  Nothing tried Associated symptoms: chills and myalgias   Associated symptoms: no abdominal pain, no fever and no vomiting   Risk factors: no recent antibiotic use, no sick contacts and no travel to endemic areas   pt reports nonbloody diarrhea, congestion over the past week No abd pain No cough No vomiting     PMH - none Soc hx - no travel History  Substance Use Topics  . Smoking status: Never Smoker   . Smokeless tobacco: Not on file  . Alcohol Use: No     Comment: ocassionally    Review of Systems  Constitutional: Positive for chills. Negative for fever.  Respiratory: Negative for cough.   Gastrointestinal: Positive for diarrhea. Negative for vomiting and abdominal pain.  Musculoskeletal: Positive for myalgias.  All other systems reviewed and are negative.     Allergies  Asa  Home Medications   Prior to Admission medications   Medication Sig Start Date End Date Taking? Authorizing Provider  Phenyleph-Doxylamine-DM-APAP (NYQUIL SEVERE COLD/FLU) 5-6.25-10-325 MG/15ML LIQD Take 30 mLs by mouth at bedtime as needed (mucous and phlegm).   Yes Historical Provider, MD  HYDROcodone-acetaminophen (NORCO) 7.5-325 MG per tablet Take 1 tablet by mouth every 4 (four) hours as needed for moderate pain. Patient not taking: Reported on 12/16/2014 04/21/14   [name] M Bryant, PA-C   BP 149/91 mmHg  Pulse 89  Temp(Src) 97.2 F (36.2 C) (Temporal)  Resp 16  Ht 6' (1.829 m)  Wt 265 lb (120.203  kg)  BMI 35.93 kg/m2  SpO2 100% Physical Exam CONSTITUTIONAL: Well developed/well nourished HEAD: Normocephalic/atraumatic EYES: EOMI/PERRL ENMT: Mucous membranes moist NECK: supple no meningeal signs SPINE/BACK:entire spine nontender CV: S1/S2 noted, no murmurs/rubs/gallops noted LUNGS: Lungs are clear to auscultation bilaterally, no apparent distress ABDOMEN: soft, nontender, no rebound or guarding, bowel sounds noted throughout abdomen GU:no cva tenderness NEURO: Pt is awake/alert/appropriate, moves all extremitiesx4.  No facial droop.   EXTREMITIES: pulses normal/equal, full ROM SKIN: warm, color normal PSYCH: no abnormalities of mood noted, alert and oriented to situation  ED Course  Procedures    Medications  ondansetron (ZOFRAN-ODT) disintegrating tablet 8 mg (8 mg Oral Given 12/16/14 1425)   Pt well appearing, taking PO, no distress, walking around room Advised to continue PO fluids He is appropriate for d/c home   MDM   Final diagnoses:  Diarrhea  Myalgia  Viral syndrome    Nursing notes including past medical history and social history reviewed and considered in documentation     Joya Gaskinsonald W Dillan Candela, MD 12/16/14 [phone]

## 2014-12-24 ENCOUNTER — Emergency Department (HOSPITAL_COMMUNITY)
Admission: EM | Admit: 2014-12-24 | Discharge: 2014-12-24 | Disposition: A | Discharge status: Home / Self Care | Payer: BLUE CROSS/BLUE SHIELD | Attending: Emergency Medicine | Admitting: Emergency Medicine

## 2014-12-24 ENCOUNTER — Encounter (HOSPITAL_COMMUNITY): Payer: Self-pay | Admitting: *Deleted

## 2014-12-24 DIAGNOSIS — R197 Diarrhea, unspecified: Secondary | ICD-10-CM | POA: Insufficient documentation

## 2014-12-24 DIAGNOSIS — R5383 Other fatigue: Secondary | ICD-10-CM | POA: Insufficient documentation

## 2014-12-24 DIAGNOSIS — R630 Anorexia: Secondary | ICD-10-CM | POA: Diagnosis not present

## 2014-12-24 DIAGNOSIS — R112 Nausea with vomiting, unspecified: Secondary | ICD-10-CM | POA: Insufficient documentation

## 2014-12-24 DIAGNOSIS — R509 Fever, unspecified: Secondary | ICD-10-CM | POA: Diagnosis not present

## 2014-12-24 DIAGNOSIS — R109 Unspecified abdominal pain: Secondary | ICD-10-CM | POA: Diagnosis present

## 2014-12-24 LAB — COMPREHENSIVE METABOLIC PANEL
ALK PHOS: 64 U/L (ref 39–117)
ALT: 37 U/L (ref 0–53)
ANION GAP: 7 (ref 5–15)
AST: 31 U/L (ref 0–37)
Albumin: 4.5 g/dL (ref 3.5–5.2)
BILIRUBIN TOTAL: 0.5 mg/dL (ref 0.3–1.2)
BUN: 16 mg/dL (ref 6–23)
CALCIUM: 9.8 mg/dL (ref 8.4–10.5)
CO2: 26 mmol/L (ref 19–32)
Chloride: 106 mEq/L (ref 96–112)
Creatinine, Ser: 1.06 mg/dL (ref 0.50–1.35)
Glucose, Bld: 92 mg/dL (ref 70–99)
POTASSIUM: 4.2 mmol/L (ref 3.5–5.1)
Sodium: 139 mmol/L (ref 135–145)
TOTAL PROTEIN: 8.6 g/dL — AB (ref 6.0–8.3)

## 2014-12-24 LAB — CBC WITH DIFFERENTIAL/PLATELET
BASOS ABS: 0 10*3/uL (ref 0.0–0.1)
BASOS PCT: 0 % (ref 0–1)
Band Neutrophils: 0 % (ref 0–10)
Blasts: 0 %
EOS ABS: 0 10*3/uL (ref 0.0–0.7)
Eosinophils Relative: 0 % (ref 0–5)
HCT: 43.1 % (ref 39.0–52.0)
HEMOGLOBIN: 14.6 g/dL (ref 13.0–17.0)
LYMPHS ABS: 2.9 10*3/uL (ref 0.7–4.0)
Lymphocytes Relative: 34 % (ref 12–46)
MCH: 30.3 pg (ref 26.0–34.0)
MCHC: 33.9 g/dL (ref 30.0–36.0)
MCV: 89.4 fL (ref 78.0–100.0)
METAMYELOCYTES PCT: 0 %
MYELOCYTES: 0 %
Monocytes Absolute: 0.4 10*3/uL (ref 0.1–1.0)
Monocytes Relative: 5 % (ref 3–12)
NEUTROS ABS: 5.3 10*3/uL (ref 1.7–7.7)
NEUTROS PCT: 61 % (ref 43–77)
Platelets: 253 10*3/uL (ref 150–400)
Promyelocytes Absolute: 0 %
RBC: 4.82 MIL/uL (ref 4.22–5.81)
RDW: 12.3 % (ref 11.5–15.5)
WBC: 8.6 10*3/uL (ref 4.0–10.5)
nRBC: 0 /100 WBC

## 2014-12-24 LAB — URINALYSIS, ROUTINE W REFLEX MICROSCOPIC
Glucose, UA: NEGATIVE mg/dL
LEUKOCYTES UA: NEGATIVE
Nitrite: NEGATIVE
PH: 5.5 (ref 5.0–8.0)
PROTEIN: 100 mg/dL — AB
Specific Gravity, Urine: >1.03 — ABNORMAL HIGH (ref 1.005–1.030)
UROBILINOGEN UA: 0.2 mg/dL (ref 0.0–1.0)

## 2014-12-24 LAB — URINE MICROSCOPIC-ADD ON

## 2014-12-24 LAB — LIPASE, BLOOD: Lipase: 24 U/L (ref 11–59)

## 2014-12-24 MED ORDER — SODIUM CHLORIDE 0.9 % IV BOLUS (SEPSIS)
1000.0000 mL | Freq: Once | INTRAVENOUS | Status: AC
  Administered 2014-12-24: 1000 mL via INTRAVENOUS

## 2014-12-24 MED ORDER — ONDANSETRON HCL 4 MG PO TABS: 4.0000 mg | ORAL_TABLET | Freq: Four times a day (QID) | ORAL | Status: DC

## 2014-12-24 MED ORDER — ACETAMINOPHEN 325 MG PO TABS
650.0000 mg | ORAL_TABLET | Freq: Once | ORAL | Status: AC
  Administered 2014-12-24: 650 mg via ORAL

## 2014-12-24 MED ORDER — ACETAMINOPHEN 325 MG PO TABS
ORAL_TABLET | ORAL | Status: AC
  Filled 2014-12-24: qty 2

## 2014-12-24 MED ORDER — ONDANSETRON HCL 4 MG/2ML IJ SOLN
4.0000 mg | Freq: Once | INTRAMUSCULAR | Status: AC
  Administered 2014-12-24: 4 mg via INTRAVENOUS
  Filled 2014-12-24: qty 2

## 2014-12-24 MED ORDER — IBUPROFEN 400 MG PO TABS: 400.0000 mg | ORAL_TABLET | Freq: Once | ORAL | Status: DC

## 2014-12-24 NOTE — ED Notes (Signed)
Gi problems for 2 weeks. Diarrhea and vomiting

## 2014-12-24 NOTE — Discharge Instructions (Signed)
Diarrhea Keep yourself hydrated and take the nausea medicine as prescribed. Follow-up with a primary care doctor. Return to the ED if you develop new or worsening symptoms. Diarrhea is frequent loose and watery bowel movements. It can cause you to feel weak and dehydrated. Dehydration can cause you to become tired and thirsty, have a dry mouth, and have decreased urination that often is dark yellow. Diarrhea is a sign of another problem, most often an infection that will not last long. In most cases, diarrhea typically lasts 2-3 days. However, it can last longer if it is a sign of something more serious. It is important to treat your diarrhea as directed by your caregiver to lessen or prevent future episodes of diarrhea. CAUSES  Some common causes include:  Gastrointestinal infections caused by viruses, bacteria, or parasites.  Food poisoning or food allergies.  Certain medicines, such as antibiotics, chemotherapy, and laxatives.  Artificial sweeteners and fructose.  Digestive disorders. HOME CARE INSTRUCTIONS  Ensure adequate fluid intake (hydration): Have 1 cup (8 oz) of fluid for each diarrhea episode. Avoid fluids that contain simple sugars or sports drinks, fruit juices, whole milk products, and sodas. Your urine should be clear or pale yellow if you are drinking enough fluids. Hydrate with an oral rehydration solution that you can purchase at pharmacies, retail stores, and online. You can prepare an oral rehydration solution at home by mixing the following ingredients together:   - tsp table salt.   tsp baking soda.   tsp salt substitute containing potassium chloride.  1  tablespoons sugar.  1 L (34 oz) of water.  Certain foods and beverages may increase the speed at which food moves through the gastrointestinal (GI) tract. These foods and beverages should be avoided and include:  Caffeinated and alcoholic beverages.  High-fiber foods, such as raw fruits and vegetables, nuts,  seeds, and whole grain breads and cereals.  Foods and beverages sweetened with sugar alcohols, such as xylitol, sorbitol, and mannitol.  Some foods may be well tolerated and may help thicken stool including:  Starchy foods, such as rice, toast, pasta, low-sugar cereal, oatmeal, grits, baked potatoes, crackers, and bagels.  Bananas.  Applesauce.  Add probiotic-rich foods to help increase healthy bacteria in the GI tract, such as yogurt and fermented milk products.  Wash your hands well after each diarrhea episode.  Only take over-the-counter or prescription medicines as directed by your caregiver.  Take a warm bath to relieve any burning or pain from frequent diarrhea episodes. SEEK IMMEDIATE MEDICAL CARE IF:   You are unable to keep fluids down.  You have persistent vomiting.  You have blood in your stool, or your stools are black and tarry.  You do not urinate in 6-8 hours, or there is only a small amount of very dark urine.  You have abdominal pain that increases or localizes.  You have weakness, dizziness, confusion, or light-headedness.  You have a severe headache.  Your diarrhea gets worse or does not get better.  You have a fever or persistent symptoms for more than 2-3 days.  You have a fever and your symptoms suddenly get worse. MAKE SURE YOU:   Understand these instructions.  Will watch your condition.  Will get help right away if you are not doing well or get worse. Document Released: 11/15/2002 Document Revised: 04/11/2014 Document Reviewed: 08/02/2012 [facility] Patient Information 2015 [place], [place]. This information is not intended to replace advice given to you by your health care provider. Make sure you  discuss any questions you have with your health care provider.   Emergency Department Resource Guide 1) Find a Doctor and Pay Out of Pocket Although you won't have to find out who is covered by your insurance plan, it is a good idea to ask around  and get recommendations. You will then need to call the office and see if the doctor you have chosen will accept you as a new patient and what types of options they offer for patients who are self-pay. Some doctors offer discounts or will set up payment plans for their patients who do not have insurance, but you will need to ask so you aren't surprised when you get to your appointment.  2) Contact Your Local Health Department Not all health departments have doctors that can see patients for sick visits, but many do, so it is worth a call to see if yours does. If you don't know where your local health department is, you can check in your phone book. The CDC also has a tool to help you locate your state's health department, and many state websites also have listings of all of their local health departments.  3) Find a Walk-in Clinic If your illness is not likely to be very severe or complicated, you may want to try a walk in clinic. These are popping up all over the country in pharmacies, drugstores, and shopping centers. They're usually staffed by nurse practitioners or physician assistants that have been trained to treat common illnesses and complaints. They're usually fairly quick and inexpensive. However, if you have serious medical issues or chronic medical problems, these are probably not your best option.  No Primary Care Doctor: - Call Health Connect at  [phone] - they can help you locate a primary care doctor that  accepts your insurance, provides certain services, etc. - Physician Referral Service- [phone]  Chronic Pain Problems: Organization         Address  Phone   Notes  [facility]  [phone] Patients need to be referred by their primary care doctor.   Medication Assistance: Organization         Address  Phone   Notes  [facility] Medication [facility] [address] [place], [place] [address] [phone] --Must be a resident  of [facility] -- Must have NO insurance coverage whatsoever (no Medicaid/ Medicare, etc.) -- The pt. MUST have a primary care doctor that directs their care regularly and follows them in the community   Med[facility]ist  [phone]   [employer]  [phone]    Agencies that provide inexpensive medical care: Organization         Address  Phone   Notes  [facility]  [phone]   [facility]    [phone]   [facility] [address] [place], [place] [address] [phone]   [facility] [address], [place] [phone]) [phone]   [facility]    [phone]) [phone]   [facility]    [phone]) [phone]   [facility]  [address], [place] Phone:  [phone], Fax:  [phone](336) [phone] Hours of Operation:  9 am - 6 pm, M-F.  Also accepts Medicaid/Medicare and self-pay.  [facility]  [address], [place] Phone: [phone], Fax: [phone]. Hours of Operation:  8:30 am - 5:30  pm, M-F.  Also accepts Medicaid and self-pay.  [facility] [place] [address], [place] Phone: [phone]) [phone]   [facility] [address], [place], [place] [phone], Ext. [phone] Mondays & Thursdays: 7-9 AM.  First 15 patients are seen on a first come, first serve basis.    Medicaid-accepting [facility] Providers:  Organization         Address  Phone   Notes  [facility] [address], [place] [phone] Also accepts self-pay patients.  [facility] [address], [place]  [phone]) [phone]   [facility] [address], [place] [phone]) [phone]   [facility] [address] [place] [address], [place] [phone]) [phone]   Renaye RakersVeita Bland [address], [place]   [phone] Only accepts [place]  Access [place] patients after they have their name applied to their card.   Self-Pay (no insurance) in [place] [facility] [facility] [place]:  Organization         Address  Phone   Notes  Sickle Cell Patients, [facility] [address], [place] [phone]) [phone]   [facility] [address], [place] [phone]) [phone]   [facility] [place]  [address], [place] [phone]) [phone]   [facility]/Dr. Osei-Bonsu  [address] [place] [address], [place] or [phone], [place] [phone] Phone number for both [place] and [place] locations is the same.  [facility] [address], [place] [phone]   [facility] [place] [address] [place] [address], [place] or [address] [phone]   [facility] [address], [place] [phone], phone; [phone]) [phone], fax Sees patients 1st and 3rd Saturday of every month.  Must not qualify for public or private insurance (i.e. Medicaid, Medicare, [place] Health Choice, Veterans' Benefits)  Household income should be no more than 200% of the poverty level The clinic cannot treat you if you are pregnant or think you are pregnant  Sexually transmitted diseases are not treated at the clinic.    Dental Care: Organization         Address  Phone  Notes  [facility] [address], [place] [phone] Accepts children up to age 26 who are enrolled in [place] or [place] Health Choice; pregnant women with a Medicaid card; and children who have applied for Medicaid or [place] Health Choice, but were declined, whose parents can pay a reduced fee at time of service.  [facility] of [facility] [place]  [address], [place] [phone] Accepts children up to age 121 who are enrolled in [place] or [place] Health Choice; pregnant women with a  Medicaid card; and children who have applied for Medicaid or [place] Health Choice, but were declined, whose parents can pay a reduced fee at time of service.  [facility]  [address], [place] [phone]) [phone] Patients are seen by appointment only. Walk-ins are not accepted. [facility] will see patients 26 years of age and older. Monday - Tuesday (8am-5pm) Most Wednesdays (8:30-5pm) $30 per visit, cash only  [facility]  [address], [facility] [place] [phone] Patients are seen by appointment only. Walk-ins are not  accepted. [facility] will see patients 33 years of age and older. One Wednesday Evening (Monthly: Volunteer Based).  $30 per visit, cash only  [employer]  [phone] for adults; Children under age 47, call Graduate Pediatric Dentistry at [phone]. Children aged 74-14, please call [phone] to request a pediatric application.  Dental services are provided in all areas of dental care including fillings, crowns and bridges, complete and partial dentures, implants, gum treatment, root canals, and extractions. Preventive care is also provided. Treatment is provided to both adults and children. Patients are selected via a lottery and there is often a waiting list.   [facility] [address], [place]  [phone] [email or web address]   [facility] [address] [place], [place] [phone], Ext. [phone] Second and Fourth Thursday of each month, opens at 6:30 AM; Clinic ends at 9 AM.  Patients are seen on a first-come first-served basis, and a limited number are seen during each clinic.   [facility]  [address] [place], [place] [phone]   Eligibility Requirements You must have lived in [place], [place], or [place] counties for at least the last three months.   You cannot be eligible for state or federal sponsored [employer], including [employer], [place], or [employer].   You generally cannot be eligible for healthcare insurance through your employer.    How to apply: Eligibility screenings are held every Tuesday and Wednesday afternoon from 1:00 pm until 4:00 pm. You do not need an appointment for the interview!  [facility] [address] [place], [place] [phone]   [facility]  [phone]   [facility] Health Department  [phone]   [facility] Health Department  [phone]    Behavioral Health Resources in the Community: Intensive Outpatient Programs Organization         Address  Phone  Notes  [facility] [address] [place], [place] [phone]   [facility] Outpatient [address] [place], [place] [phone]   [facility] [address], [place], [place]  [phone]   [facility] [address]  [place], [place] [phone] or [phone]   Substance Abuse Resources Organization         Address  Phone  Notes  Alcohol and Drug Services  5060673730   [facility]  [phone]   [facility] [place]  [phone]   Floydene Flock  [phone]   Residential & Outpatient Substance Abuse Program  [phone]   Psychological Services Organization         Address  Phone  Notes  [facility]  [phone]   [facility] Services  [phone]   [facility] [address] [place] [phone] or [phone]    Mobile Crisis Teams Organization         Address  Phone  Notes  [facility], Mobile Crisis Care Unit  [phone]   [facility]  [address] [place], [place] [phone]   Doristine Locks [address] [place] [phone]    Self-Help/Support Groups Organization         Address  Phone             Notes  Mental  [facility] [place] - variety of support groups  336- I7437963 Call for more information  Narcotics Anonymous (NA), Caring  Services [address], [employer] [place]  2 meetings at this location   Residential Sports administrator         Address  Phone  Notes  [facility] [address],    [place]  [phone]   [facility]  [address], [place] [address], [place], [place] [phone]   [facility] [address] [place], [place] [phone] Admissions: 8am-3pm M-F  [facility] [address],    [place], [place] [phone]   [facility] [address] [place], [place], [place] [phone]   [facility] [address],  [place], [place] [phone]   [facility] - Intensive Outpatient [address], [place], [place] [phone]   [facility] ([facility] [address]  [place], [place] [phone] or [phone]   [facility] ([facility]) [address] [place], [place] [phone] Accepts Medicaid  [facility] [address]  [place] [phone] Substance Abuse/Addiction Treatment   [facility] Organization         Address  Phone  Notes  [facility]  [phone]   Angie Fava, PhD [address] [place]vin Knack [place], [place]   [phone] or [phone]   [facility] Behavioral   [address] [place], [place] [phone]   [facility] [address], [place], [place] [phone] Insurance/Medicaid/sponsorship through [facility] Families [address]                                    [place], [place] [phone] Therapy/tele-psych/case  [facility] [address], [place] [phone]    Dr. Lolly Mustache  [phone]   [facility] [place]  [employer] [facility] Dept. 1) [address],  [place] 2) [address], [place] 3)  [address], [place] [phone]  [phone]   [facility] Child Abuse Hotline (706) 738-3143 or [phone] (After Hours)

## 2014-12-24 NOTE — ED Notes (Signed)
Patient with no complaints at this time. Respirations even and unlabored. Skin warm/dry. Discharge instructions reviewed with patient at this time. Patient given opportunity to voice concerns/ask questions. IV removed per policy and band-aid applied to site. Patient discharged at this time and left Emergency Department with steady gait.  

## 2014-12-24 NOTE — ED Provider Notes (Signed)
CSN: [id number]     Arrival date & time 12/24/14  1608 History   First MD Initiated Contact with Patient 12/24/14 1647     Chief Complaint  Patient presents with  . Abdominal Pain     (Consider location/radiation/quality/duration/timing/severity/associated sxs/prior Treatment) HPI Comments: Patient reports he has been sick for 2 weeks with diarrhea and abdominal pain. Seen on January 8 and told he likely had a viral syndrome. States she's had 3-4 episodes of nonbloody bowel movements daily for at least 2 weeks. Today he developed 2 episodes of nonbilious nonbloody vomiting. Denies any fever at home. Temperature 100.2 in ED. Denies any sick contacts. Denies any recent travel. Denies any antibiotic use. Has some abdominal cramping after vomiting. Denies any chest pain or shortness of breath. Denies any urinary symptoms or testicular pain. No previous GI history.  The history is provided by the patient.    History reviewed. No pertinent past medical history. History reviewed. No pertinent past surgical history. No family history on file. History  Substance Use Topics  . Smoking status: Never Smoker   . Smokeless tobacco: Not on file  . Alcohol Use: No     Comment: ocassionally    Review of Systems  Constitutional: Positive for fever, activity change, appetite change and fatigue.  Respiratory: Negative for cough, chest tightness and shortness of breath.   Cardiovascular: Negative for chest pain.  Gastrointestinal: Positive for nausea, vomiting, abdominal pain and diarrhea.  Genitourinary: Negative for dysuria, urgency, hematuria and testicular pain.  Musculoskeletal: Negative for myalgias, back pain and arthralgias.  Skin: Negative for rash.  Neurological: Negative for dizziness, weakness and headaches.  A complete 10 system review of systems was obtained and all systems are negative except as noted in the HPI and PMH.      Allergies  Asa  Home Medications   Prior to  Admission medications   Medication Sig Start Date End Date Taking? Authorizing Provider  HYDROcodone-acetaminophen (NORCO) 7.5-325 MG per tablet Take 1 tablet by mouth every 4 (four) hours as needed for moderate pain. Patient not taking: Reported on 12/16/2014 04/21/14   Kathie Dike, PA-C  ondansetron (ZOFRAN) 4 MG tablet Take 1 tablet (4 mg total) by mouth every 6 (six) hours. 12/24/14   Glynn Octave, MD   BP 127/66 mmHg  Pulse 89  Temp(Src) 100.2 F (37.9 C) (Oral)  Resp 16  Ht 6' (1.829 m)  Wt 275 lb (124.739 kg)  BMI 37.29 kg/m2  SpO2 99% Physical Exam  Constitutional: He is oriented to person, place, and time. He appears well-developed and well-nourished. No distress.  HENT:  Head: Normocephalic and atraumatic.  Mouth/Throat: Oropharynx is clear and moist. No oropharyngeal exudate.  Eyes: Conjunctivae and EOM are normal. Pupils are equal, round, and reactive to light.  Neck: Normal range of motion. Neck supple.  No meningismus.  Cardiovascular: Normal rate, regular rhythm, normal heart sounds and intact distal pulses.   No murmur heard. Pulmonary/Chest: Effort normal and breath sounds normal. No respiratory distress.  Abdominal: Soft. There is no tenderness. There is no rebound and no guarding.  Soft, no guarding or rebound  Genitourinary:  No testicular pain  Musculoskeletal: Normal range of motion. He exhibits no edema or tenderness.  No CVAT  Neurological: He is alert and oriented to person, place, and time. No cranial nerve deficit. He exhibits normal muscle tone. Coordination normal.  No ataxia on finger to nose bilaterally. No pronator drift. 5/5 strength throughout. CN 2-12 intact. Negative Romberg.  Equal grip strength. Sensation intact. Gait is normal.   Skin: Skin is warm.  Psychiatric: He has a normal mood and affect. His behavior is normal.  Nursing note and vitals reviewed.   ED Course  Procedures (including critical care time) Labs Review Labs Reviewed   URINALYSIS, ROUTINE W REFLEX MICROSCOPIC - Abnormal; Notable for the following:    Specific Gravity, Urine >1.030 (*)    Hgb urine dipstick SMALL (*)    Bilirubin Urine SMALL (*)    Ketones, ur TRACE (*)    Protein, ur 100 (*)    All other components within normal limits  COMPREHENSIVE METABOLIC PANEL - Abnormal; Notable for the following:    Total Protein 8.6 (*)    All other components within normal limits  URINE MICROSCOPIC-ADD ON - Abnormal; Notable for the following:    Bacteria, UA FEW (*)    All other components within normal limits  STOOL CULTURE  CBC WITH DIFFERENTIAL  LIPASE, BLOOD    Imaging Review No results found.   EKG Interpretation None      MDM   Final diagnoses:  Diarrhea   2 weeks of diarrhea with one day of vomiting and crampy abdominal pain. Temp 100.2.  Abdomen soft without peritoneal signs. Moist mucous membranes.  Labs unremarkable. UA was small ketones. Stool sample sent.  Patient feels improved after IV fluids. Tolerating by mouth. Abdomen remains soft and nontender. Discussed will call patient's if stool culture is positive.  Temp 98.8 on recheck. Patient well appearing. Speaking in phone.  Follow-up with PCP and GI if recurrent diarrhea. Abdomen is soft without peritoneal signs, no indication for imaging today.  BP 127/66 mmHg  Pulse 89  Temp(Src) 100.2 F (37.9 C) (Oral)  Resp 16  Ht 6' (1.829 m)  Wt 275 lb (124.739 kg)  BMI 37.29 kg/m2  SpO2 99%   Glynn OctaveStephen Jonee Lamore, MD 12/24/14 2151

## 2014-12-28 LAB — STOOL CULTURE

## 2016-11-12 ENCOUNTER — Encounter (HOSPITAL_COMMUNITY): Payer: Self-pay | Admitting: *Deleted

## 2016-11-12 ENCOUNTER — Emergency Department (HOSPITAL_COMMUNITY): Payer: BLUE CROSS/BLUE SHIELD

## 2016-11-12 ENCOUNTER — Emergency Department (HOSPITAL_COMMUNITY)
Admission: EM | Admit: 2016-11-12 | Discharge: 2016-11-12 | Disposition: A | Discharge status: Home / Self Care | Payer: BLUE CROSS/BLUE SHIELD | Attending: Emergency Medicine | Admitting: Emergency Medicine

## 2016-11-12 DIAGNOSIS — X500XXA Overexertion from strenuous movement or load, initial encounter: Secondary | ICD-10-CM | POA: Diagnosis not present

## 2016-11-12 DIAGNOSIS — Z79899 Other long term (current) drug therapy: Secondary | ICD-10-CM | POA: Insufficient documentation

## 2016-11-12 DIAGNOSIS — Y990 Civilian activity done for income or pay: Secondary | ICD-10-CM | POA: Diagnosis not present

## 2016-11-12 DIAGNOSIS — Y929 Unspecified place or not applicable: Secondary | ICD-10-CM | POA: Insufficient documentation

## 2016-11-12 DIAGNOSIS — Y93F2 Activity, caregiving, lifting: Secondary | ICD-10-CM | POA: Diagnosis not present

## 2016-11-12 DIAGNOSIS — M436 Torticollis: Secondary | ICD-10-CM | POA: Diagnosis not present

## 2016-11-12 DIAGNOSIS — S199XXA Unspecified injury of neck, initial encounter: Secondary | ICD-10-CM | POA: Diagnosis present

## 2016-11-12 MED ORDER — IBUPROFEN 800 MG PO TABS
800.0000 mg | ORAL_TABLET | Freq: Once | ORAL | Status: AC
  Administered 2016-11-12: 800 mg via ORAL
  Filled 2016-11-12: qty 1

## 2016-11-12 MED ORDER — METHOCARBAMOL 500 MG PO TABS: 1000.0000 mg | ORAL_TABLET | Freq: Four times a day (QID) | ORAL | 0 refills | Status: AC

## 2016-11-12 MED ORDER — IBUPROFEN 600 MG PO TABS: 600.0000 mg | ORAL_TABLET | Freq: Four times a day (QID) | ORAL | 0 refills | Status: DC | PRN

## 2016-11-12 MED ORDER — METHOCARBAMOL 500 MG PO TABS
1000.0000 mg | ORAL_TABLET | Freq: Once | ORAL | Status: AC
  Administered 2016-11-12: 1000 mg via ORAL
  Filled 2016-11-12: qty 2

## 2016-11-12 NOTE — Discharge Instructions (Signed)
Take the medicines prescribed in addition to applying a heating pad for 20 minutes 2-3 times daily followed by stretching exercises as we discussed.  Expect gradual improvement over the next 2-5 days.  Get rechecked if this treatment does not resolve your symptoms.  Your xrays are negative today.

## 2016-11-12 NOTE — ED Provider Notes (Signed)
[facility]-EMERGENCY DEPT Provider Note   CSN: [id number] Arrival date & time: 11/12/16  [id number]     History   Chief Complaint Chief Complaint  Patient presents with  . Neck Pain    HPI Jonathan Crawford is a 25 y.o. male presenting with right sided neck and shoulder pain which started yesterday.  He describes soreness across the site yesterday, but now woke today with decreased ability to rotate his head to the right without severe pain.  He denies history of neck or back injury or prior episodes of similar symptoms.  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  He denies weakness or numbness in his arms or hands and has had no recent illnesses, fevers or chills.  He has had no medications or treatment for this condition prior to arrival.  The history is provided by the patient.    History reviewed. No pertinent past medical history.  There are no active problems to display for this patient.   History reviewed. No pertinent surgical history.     Home Medications    Prior to Admission medications   Medication Sig Start Date End Date Taking? Authorizing Provider  HYDROcodone-acetaminophen (NORCO) 7.5-325 MG per tablet Take 1 tablet by mouth every 4 (four) hours as needed for moderate pain. Patient not taking: Reported on 12/16/2014 04/21/14   Ivery QualeHobson Bryant, PA-C  ibuprofen (ADVIL,MOTRIN) 600 MG tablet Take 1 tablet (600 mg total) by mouth every 6 (six) hours as needed. 11/12/16   Burgess AmorJulie Jovaun Levene, PA-C  methocarbamol (ROBAXIN) 500 MG tablet Take 2 tablets (1,000 mg total) by mouth 4 (four) times daily. 11/12/16 11/22/16  Burgess AmorJulie Ariannie Penaloza, PA-C  ondansetron (ZOFRAN) 4 MG tablet Take 1 tablet (4 mg total) by mouth every 6 (six) hours. Patient not taking: Reported on 11/12/2016 12/24/14   Glynn OctaveStephen Rancour, MD    Family History No family history on file.  Social History Social History  Substance Use Topics  . Smoking status: Never  Smoker  . Smokeless tobacco: Never Used  . Alcohol use Yes     Comment: ocassionally     Allergies   Asa [aspirin]   Review of Systems Review of Systems  Constitutional: Negative for chills and fever.  Respiratory: Negative for shortness of breath.   Cardiovascular: Negative for chest pain and leg swelling.  Gastrointestinal: Negative for abdominal distention, abdominal pain and constipation.  Genitourinary: Negative for difficulty urinating, dysuria, flank pain, frequency and urgency.  Musculoskeletal: Positive for back pain, neck pain and neck stiffness. Negative for gait problem and joint swelling.  Skin: Negative for rash.  Neurological: Negative for weakness and numbness.     Physical Exam Updated Vital Signs Pulse (!) 57   Temp 98 F (36.7 C) (Oral)   Resp 17   Ht 6' (1.829 m)   Wt 106.6 kg   BMI 31.87 kg/m   Physical Exam  Constitutional: He appears well-developed and well-nourished.  HENT:  Head: Normocephalic.  Eyes: Conjunctivae are normal.  Neck: Normal range of motion. Neck supple.  Cardiovascular: Normal rate and intact distal pulses.   Pedal pulses normal.  Pulmonary/Chest: Effort normal.  Abdominal: Soft. Bowel sounds are normal. He exhibits no distension and no mass.  Musculoskeletal: He exhibits no edema.       Cervical back: He exhibits decreased range of motion, tenderness and spasm.       Lumbar back: Normal.  Tentative palpation right paracervical  and across right trapezius to upper scapula.  Moderate spasm appreciated.  Decreased head rotation rightward, normal left and normal flexion and extension.  Neurological: He is alert. He has normal strength. He displays no atrophy and no tremor. No sensory deficit. Gait normal.  Reflex Scores:      Patellar reflexes are 2+ on the right side and 2+ on the left side.      Achilles reflexes are 2+ on the right side and 2+ on the left side. No strength deficit noted in elbow and wrist flexor and extensor  muscle groups.  Equal grip strength  Skin: Skin is warm and dry.  Psychiatric: He has a normal mood and affect.  Nursing note and vitals reviewed.    ED Treatments / Results  Labs (all labs ordered are listed, but only abnormal results are displayed) Labs Reviewed - No data to display  EKG  EKG Interpretation None       Radiology Dg Cervical Spine Complete  Result Date: 11/12/2016 CLINICAL DATA:  Right side neck pain, neck stiffness starting this morning, right shoulder pain EXAM: CERVICAL SPINE - COMPLETE 4+ VIEW COMPARISON:  None. FINDINGS: There is no evidence of cervical spine fracture or prevertebral soft tissue swelling. Alignment is normal. No other significant bone abnormalities are identified. IMPRESSION: Negative cervical spine radiographs. Electronically Signed   By: Natasha MeadLiviu  Pop M.D.   On: 11/12/2016 10:46    Procedures Procedures (including critical care time)  Medications Ordered in ED Medications  methocarbamol (ROBAXIN) tablet 1,000 mg (1,000 mg Oral Given 11/12/16 1024)  ibuprofen (ADVIL,MOTRIN) tablet 800 mg (800 mg Oral Given 11/12/16 1024)     Initial Impression / Assessment and Plan / ED Course  I have reviewed the triage vital signs and the nursing notes.  Pertinent labs & imaging results that were available during my care of the patient were reviewed by me and considered in my medical decision making (see chart for details).  Clinical Course     Patient prescribed Robaxin and ibuprofen.  Discussed heat therapy followed by range of motion exercises which were demonstrated.  P and follow-up of symptoms are not improved over the next week.  The patient appears reasonably screened and/or stabilized for discharge and I doubt any other medical condition or other [facility] requiring further screening, evaluation, or treatment in the ED at this time prior to discharge.   Final Clinical Impressions(s) / ED Diagnoses   Final diagnoses:  Torticollis, acute     New Prescriptions Discharge Medication List as of 11/12/2016 11:11 AM    START taking these medications   Details  ibuprofen (ADVIL,MOTRIN) 600 MG tablet Take 1 tablet (600 mg total) by mouth every 6 (six) hours as needed., Starting Tue 11/12/2016, Print    methocarbamol (ROBAXIN) 500 MG tablet Take 2 tablets (1,000 mg total) by mouth 4 (four) times daily., Starting Tue 11/12/2016, Until Fri 11/22/2016, Print         Burgess AmorJulie Linnet Bottari, PA-C 11/12/16 [id number]    Bethann BerkshireJoseph Zammit, MD 11/12/16 [phone]

## 2016-11-12 NOTE — ED Triage Notes (Signed)
Pt reports right sided neck, shoulder and back pain that started yesterday. Pt reports he can't move his neck to the right side without pain. Pt took Flexeril at home with no relief. Denies CP, SOB.

## 2019-08-29 ENCOUNTER — Encounter (HOSPITAL_COMMUNITY): Payer: Self-pay

## 2019-08-29 ENCOUNTER — Other Ambulatory Visit: Payer: Self-pay

## 2019-08-29 DIAGNOSIS — N342 Other urethritis: Secondary | ICD-10-CM | POA: Insufficient documentation

## 2019-08-29 NOTE — ED Triage Notes (Signed)
Pt reports penile discharge that he noticed this evening. Pt denies pain, burning, or odor. Reports having unprotected sex this morning.

## 2019-08-30 ENCOUNTER — Other Ambulatory Visit: Payer: Self-pay

## 2019-08-30 ENCOUNTER — Emergency Department (HOSPITAL_COMMUNITY)
Admission: EM | Admit: 2019-08-30 | Discharge: 2019-08-30 | Disposition: A | Discharge status: Home / Self Care | Payer: BLUE CROSS/BLUE SHIELD | Attending: Emergency Medicine | Admitting: Emergency Medicine

## 2019-08-30 DIAGNOSIS — N342 Other urethritis: Secondary | ICD-10-CM

## 2019-08-30 MED ORDER — AZITHROMYCIN 250 MG PO TABS
1000.0000 mg | ORAL_TABLET | Freq: Once | ORAL | Status: AC
  Administered 2019-08-30: 1000 mg via ORAL
  Filled 2019-08-30: qty 4

## 2019-08-30 MED ORDER — LIDOCAINE HCL (PF) 1 % IJ SOLN
INTRAMUSCULAR | Status: AC
  Administered 2019-08-30: 02:00:00 0.9 mL
  Filled 2019-08-30: qty 2

## 2019-08-30 MED ORDER — CEFTRIAXONE SODIUM 250 MG IJ SOLR
250.0000 mg | Freq: Once | INTRAMUSCULAR | Status: AC
  Administered 2019-08-30: 250 mg via INTRAMUSCULAR
  Filled 2019-08-30: qty 250

## 2019-08-30 NOTE — ED Provider Notes (Signed)
  [facility] EMERGENCY DEPARTMENT Provider Note   CSN: [id number] Arrival date & time: 08/29/19  2310     History   Chief Complaint Chief Complaint  Patient presents with  . Penile Discharge    HPI Jonathan Crawford is a 28 y.o. male.     The history is provided by the patient.  Penile Discharge This is a new problem. The current episode started 3 to 5 hours ago. The problem has been gradually worsening. Pertinent negatives include no abdominal pain. Nothing aggravates the symptoms. Nothing relieves the symptoms.   Patient reports penile discharge for the past several hours.  He also reports mild dysuria.  No fevers or vomiting.  No abdominal pain. He reports unprotected sex in the past 24 hours.  PMH-none Home Medications    Prior to Admission medications   Not on File    Family History No family history on file.  Social History Social History   Tobacco Use  . Smoking status: Never Smoker  . Smokeless tobacco: Never Used  Substance Use Topics  . Alcohol use: Yes    Comment: ocassionally  . Drug use: No     Allergies   Asa [aspirin]   Review of Systems Review of Systems  Gastrointestinal: Negative for abdominal pain.  Genitourinary: Positive for discharge. Negative for genital sores.     Physical Exam Updated Vital Signs BP (!) 163/112 (BP Location: Right Arm)   Pulse 60   Temp 98.2 F (36.8 C) (Oral)   Resp 17   Ht 1.829 m (6')   Wt 108.9 kg   SpO2 100%   BMI 32.55 kg/m   Physical Exam CONSTITUTIONAL: Well developed/well nourished HEAD: Normocephalic/atraumatic EYES: EOMI/PERRL ENMT: Mask in place NECK: supple no meningeal signs LUNGS:  no apparent distress ABDOMEN: soft GU: Small amount of white penile discharge.  No penile lesions.  Chaperone present NEURO: Pt is awake/alert/appropriate, moves all extremitiesx4.  No facial droop.   EXTREMITIES:  full ROM SKIN: warm, color normal PSYCH: no abnormalities of mood noted, alert and  oriented to situation   ED Treatments / Results  Labs (all labs ordered are listed, but only abnormal results are displayed) Labs Reviewed  GC/CHLAMYDIA PROBE AMP ([place]) NOT AT [facility]    EKG None  Radiology No results found.  Procedures Procedures   Medications Ordered in ED Medications  cefTRIAXone (ROCEPHIN) injection 250 mg (250 mg Intramuscular Given 08/30/19 0206)  azithromycin (ZITHROMAX) tablet 1,000 mg (1,000 mg Oral Given 08/30/19 0205)  lidocaine (PF) (XYLOCAINE) 1 % injection (0.9 mLs  Given 08/30/19 0206)     Initial Impression / Assessment and Plan / ED Course  I have reviewed the triage vital signs and the nursing notes.     We will Empirically treat for gonorrhea and chlamydia We discussed safe sex precautions  Final Clinical Impressions(s) / ED Diagnoses   Final diagnoses:  Urethritis    ED Discharge Orders    None       Ripley Fraise, MD 08/30/19 [phone]

## 2019-08-31 LAB — CYTOLOGY, (ORAL, ANAL, URETHRAL) ANCILLARY ONLY
Chlamydia: NEGATIVE
Neisseria Gonorrhea: POSITIVE — AB

## 2019-09-03 ENCOUNTER — Telehealth (HOSPITAL_COMMUNITY): Payer: Self-pay

## 2020-03-14 ENCOUNTER — Emergency Department (HOSPITAL_COMMUNITY)
Admission: EM | Admit: 2020-03-14 | Discharge: 2020-03-14 | Disposition: A | Discharge status: Home / Self Care | Payer: Self-pay | Attending: Emergency Medicine | Admitting: Emergency Medicine

## 2020-03-14 ENCOUNTER — Other Ambulatory Visit: Payer: Self-pay

## 2020-03-14 ENCOUNTER — Encounter (HOSPITAL_COMMUNITY): Payer: Self-pay | Admitting: Emergency Medicine

## 2020-03-14 DIAGNOSIS — J302 Other seasonal allergic rhinitis: Secondary | ICD-10-CM | POA: Insufficient documentation

## 2020-03-14 DIAGNOSIS — R519 Headache, unspecified: Secondary | ICD-10-CM

## 2020-03-14 MED ORDER — LORATADINE 10 MG PO TABS: 10.0000 mg | ORAL_TABLET | Freq: Every day | ORAL | 1 refills | Status: AC

## 2020-03-14 MED ORDER — FLUTICASONE PROPIONATE 50 MCG/ACT NA SUSP: 1.0000 | Freq: Every day | NASAL | 2 refills | Status: AC

## 2020-03-14 NOTE — ED Triage Notes (Signed)
Pt c/o of headache and sinus pressure x 3 days. Pt states taking OTC allergy meds with no relief

## 2020-03-14 NOTE — Progress Notes (Signed)
CSW at bedside to address the matter of patient being without insurance and primary care. Patient clarified and explains that he has [employer]. CSW provided patient with a list of primary care providers that are accepting new patients in his area. CSW asked if patient needed assistance navigating the list and scheduling an appointment. Patient replied no.   No further needs at this time  Sharese Manrique Tomma Rakers Transitions of Care  Clinical Social Worker  Ph: [phone]

## 2020-03-14 NOTE — ED Provider Notes (Signed)
[facility] Emergency Department Provider Note MRN:  [id number]  Arrival date & time: 03/14/20     Chief Complaint   Headache   History of Present Illness   Jonathan Crawford is a 29 y.o. year-old male with no pertinent past medical history presenting to the ED with chief complaint of headache.  Patient explains that he gets the same headache every spring and he is sure that it is related to his sinuses.  Right-sided temporal headache with pressure behind the eyes, pressure below the eyes.  Denies watery eyes, no itchy eyes, no nasal congestion or runny nose, no cough, no fever.  No numbness or weakness to the arms or legs.  Symptoms intermittent and mild to moderate for the past 3 days.  Alka-Seltzer and Sudafed not helping.  Review of Systems  A complete 10 system review of systems was obtained and all systems are negative except as noted in the HPI and PMH.   Patient's Health History   History reviewed. No pertinent past medical history.  History reviewed. No pertinent surgical history.  History reviewed. No pertinent family history.  Social History   Socioeconomic History  . Marital status: Single    Spouse name: Not on file  . Number of children: Not on file  . Years of education: Not on file  . Highest education level: Not on file  Occupational History  . Not on file  Tobacco Use  . Smoking status: Never Smoker  . Smokeless tobacco: Never Used  Substance and Sexual Activity  . Alcohol use: Yes    Comment: ocassionally  . Drug use: No  . Sexual activity: Not on file  Other Topics Concern  . Not on file  Social History Narrative  . Not on file   Social Determinants of Health   Financial Resource Strain:   . Difficulty of Paying Living Expenses:   Food Insecurity:   . Worried About Charity fundraiser in the Last Year:   . Arboriculturist in the Last Year:   Transportation Needs:   . Film/video editor (Medical):   Marland Kitchen Lack of  Transportation (Non-Medical):   Physical Activity:   . Days of Exercise per Week:   . Minutes of Exercise per Session:   Stress:   . Feeling of Stress :   Social Connections:   . Frequency of Communication with Friends and Family:   . Frequency of Social Gatherings with Friends and Family:   . Attends Religious Services:   . Active Member of Clubs or Organizations:   . Attends Archivist Meetings:   Marland Kitchen Marital Status:   Intimate Partner Violence:   . Fear of Current or Ex-Partner:   . Emotionally Abused:   Marland Kitchen Physically Abused:   . Sexually Abused:      Physical Exam   Vitals:   03/14/20 0959 03/14/20 1001  BP:  (!) 153/95  Pulse: 62   Resp: 15   Temp: 98 F (36.7 C)   SpO2: 99%     CONSTITUTIONAL: Well-appearing, NAD NEURO:  Alert and oriented x 3, normal and symmetric strength and sensation, normal coordination, normal speech, no meningismus. EYES:  eyes equal and reactive ENT/NECK:  no LAD, no JVD; swollen and mildly erythematous right nasal turbinates CARDIO: Regular rate, well-perfused, normal S1 and S2 PULM:  CTAB no wheezing or rhonchi GI/GU:  normal bowel sounds, non-distended, non-tender MSK/SPINE:  No gross deformities, no edema SKIN:  no rash, atraumatic  PSYCH:  Appropriate speech and behavior  *Additional and/or pertinent findings included in MDM below  Diagnostic and Interventional Summary    EKG Interpretation  Date/Time:    Ventricular Rate:    PR Interval:    QRS Duration:   QT Interval:    QTC Calculation:   R Axis:     Text Interpretation:        Labs Reviewed - No data to display  No orders to display    Medications - No data to display   Procedures  /  Critical Care Procedures  ED Course and Medical Decision Making  I have reviewed the triage vital signs, the nursing notes, and pertinent available records from the EMR.  Pertinent labs & imaging results that were available during my care of the patient were reviewed by  me and considered in my medical decision making (see below for details).     Consistent with seasonal allergies causing sinus pressure and headache.  Providing antihistamine medication, appropriate for discharge.    Elmer Sow. Pilar Plate, MD [facility] Emergency Medicine [facility] [email or web address]  Final Clinical Impressions(s) / ED Diagnoses     ICD-10-CM   1. Seasonal allergies  J30.2   2. Sinus headache  R51.9     ED Discharge Orders         Ordered    loratadine (CLARITIN) 10 MG tablet  Daily     03/14/20 1012    fluticasone (FLONASE) 50 MCG/ACT nasal spray  Daily     03/14/20 1012           Discharge Instructions Discussed with and Provided to Patient:     Discharge Instructions     You were evaluated in the Emergency Department and after careful evaluation, we did not find any emergent condition requiring admission or further testing in the hospital.  Your exam/testing today was overall reassuring.  Symptoms seem to be due to allergies.  Please take the antihistamine medications as needed.  Please return to the Emergency Department if you experience any worsening of your condition.  We encourage you to follow up with a primary care provider.  Thank you for allowing [place] to be a part of your care.       Sabas Sous, MD 03/14/20 1016

## 2020-03-14 NOTE — Discharge Instructions (Signed)
You were evaluated in the Emergency Department and after careful evaluation, we did not find any emergent condition requiring admission or further testing in the hospital.  Your exam/testing today was overall reassuring.  Symptoms seem to be due to allergies.  Please take the antihistamine medications as needed.  Please return to the Emergency Department if you experience any worsening of your condition.  We encourage you to follow up with a primary care provider.  Thank you for allowing [place] to be a part of your care.

## 2021-01-02 ENCOUNTER — Other Ambulatory Visit: Payer: Self-pay

## 2021-01-02 ENCOUNTER — Encounter (HOSPITAL_COMMUNITY): Payer: Self-pay | Admitting: Emergency Medicine

## 2021-01-02 ENCOUNTER — Emergency Department (HOSPITAL_COMMUNITY): Payer: BC Managed Care – PPO

## 2021-01-02 ENCOUNTER — Emergency Department (HOSPITAL_COMMUNITY)
Admission: EM | Admit: 2021-01-02 | Discharge: 2021-01-02 | Disposition: A | Discharge status: Home / Self Care | Payer: BC Managed Care – PPO | Attending: Emergency Medicine | Admitting: Emergency Medicine

## 2021-01-02 DIAGNOSIS — S0990XA Unspecified injury of head, initial encounter: Secondary | ICD-10-CM

## 2021-01-02 DIAGNOSIS — M79601 Pain in right arm: Secondary | ICD-10-CM | POA: Insufficient documentation

## 2021-01-02 DIAGNOSIS — S0093XA Contusion of unspecified part of head, initial encounter: Secondary | ICD-10-CM | POA: Insufficient documentation

## 2021-01-02 DIAGNOSIS — S199XXA Unspecified injury of neck, initial encounter: Secondary | ICD-10-CM | POA: Insufficient documentation

## 2021-01-02 DIAGNOSIS — T07XXXA Unspecified multiple injuries, initial encounter: Secondary | ICD-10-CM

## 2021-01-02 DIAGNOSIS — Y92410 Unspecified street and highway as the place of occurrence of the external cause: Secondary | ICD-10-CM | POA: Insufficient documentation
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
## 2021-01-02 NOTE — Discharge Instructions (Signed)
There were no serious injuries found after the evaluation today. You are likely to continue to have some pain for the next several days. Use either Tylenol or Motrin for the pain. If you are still uncomfortable after a week or 2, check with the doctor of your choice for ongoing management. Otherwise gradually increase your levels of activity, as tolerated.

## 2021-08-12 ENCOUNTER — Other Ambulatory Visit: Payer: Self-pay

## 2021-08-12 ENCOUNTER — Encounter (HOSPITAL_COMMUNITY): Payer: Self-pay

## 2021-08-12 ENCOUNTER — Emergency Department (HOSPITAL_COMMUNITY)
Admission: EM | Admit: 2021-08-12 | Discharge: 2021-08-13 | Disposition: A | Discharge status: Home / Self Care | Payer: BC Managed Care – PPO | Attending: Emergency Medicine | Admitting: Emergency Medicine

## 2021-08-12 DIAGNOSIS — R1084 Generalized abdominal pain: Secondary | ICD-10-CM | POA: Insufficient documentation

## 2021-08-12 DIAGNOSIS — R369 Urethral discharge, unspecified: Secondary | ICD-10-CM | POA: Diagnosis not present

## 2021-08-12 DIAGNOSIS — R109 Unspecified abdominal pain: Secondary | ICD-10-CM | POA: Diagnosis present

## 2021-08-12 LAB — COMPREHENSIVE METABOLIC PANEL
ALT: 24 U/L (ref 0–44)
AST: 17 U/L (ref 15–41)
Albumin: 4.2 g/dL (ref 3.5–5.0)
Alkaline Phosphatase: 60 U/L (ref 38–126)
Anion gap: 6 (ref 5–15)
BUN: 16 mg/dL (ref 6–20)
CO2: 28 mmol/L (ref 22–32)
Calcium: 9.3 mg/dL (ref 8.9–10.3)
Chloride: 105 mmol/L (ref 98–111)
Creatinine, Ser: 0.97 mg/dL (ref 0.61–1.24)
GFR, Estimated: >60 mL/min (ref >60)
Glucose, Bld: 94 mg/dL (ref 70–99)
Potassium: 3.7 mmol/L (ref 3.5–5.1)
Sodium: 139 mmol/L (ref 135–145)
Total Bilirubin: 0.4 mg/dL (ref 0.3–1.2)
Total Protein: 7.4 g/dL (ref 6.5–8.1)

## 2021-08-12 LAB — CBC
HCT: 40.7 % (ref 39.0–52.0)
Hemoglobin: 13.7 g/dL (ref 13.0–17.0)
MCH: 31.8 pg (ref 26.0–34.0)
MCHC: 33.7 g/dL (ref 30.0–36.0)
MCV: 94.4 fL (ref 80.0–100.0)
Platelets: 231 10*3/uL (ref 150–400)
RBC: 4.31 MIL/uL (ref 4.22–5.81)
RDW: 12.6 % (ref 11.5–15.5)
WBC: 5.3 10*3/uL (ref 4.0–10.5)
nRBC: 0 % (ref 0.0–0.2)

## 2021-08-12 LAB — LIPASE, BLOOD: Lipase: 32 U/L (ref 11–51)

## 2021-08-12 NOTE — ED Triage Notes (Signed)
Abd cramps since this am. No n/v/d. No sick contacts.

## 2021-08-13 ENCOUNTER — Emergency Department (HOSPITAL_COMMUNITY): Payer: BC Managed Care – PPO

## 2021-08-13 LAB — URINALYSIS, MICROSCOPIC (REFLEX)
Bacteria, UA: NONE SEEN
Squamous Epithelial / HPF: NONE SEEN (ref 0–5)

## 2021-08-13 LAB — URINALYSIS, ROUTINE W REFLEX MICROSCOPIC
Bilirubin Urine: NEGATIVE
Glucose, UA: NEGATIVE mg/dL
Ketones, ur: NEGATIVE mg/dL
Nitrite: NEGATIVE
Protein, ur: NEGATIVE mg/dL
Specific Gravity, Urine: >1.03 — ABNORMAL HIGH (ref 1.005–1.030)
pH: 6 (ref 5.0–8.0)

## 2021-08-13 MED ORDER — DICYCLOMINE HCL 20 MG PO TABS: 20.0000 mg | ORAL_TABLET | Freq: Three times a day (TID) | ORAL | 0 refills | Status: AC

## 2021-08-13 NOTE — ED Provider Notes (Signed)
[facility] EMERGENCY DEPARTMENT Provider Note   CSN: [id number] Arrival date & time: 08/12/21  1950     History Chief Complaint  Patient presents with   Abdominal Pain    Jonathan Crawford is a 30 y.o. male.  Patient presents to the emergency department for evaluation of abdominal cramping has been on and off all day.  Patient reports he has episodes of cramping pain that then resolves.  It has been on and off all day and he has not identified anything that causes it.  No vomiting or diarrhea.  He has not had a normal bowel movement today.  He has not had any fever.  No URI or other cold symptoms.  Patient does report that when he urinated while waiting in the waiting room there was a small amount of discharge from the tip of his penis which he has not seen before.  No associated dysuria.      History reviewed. No pertinent past medical history.  There are no problems to display for this patient.   History reviewed. No pertinent surgical history.     History reviewed. No pertinent family history.  Social History   Tobacco Use   Smoking status: Never   Smokeless tobacco: Never  Vaping Use   Vaping Use: Never used  Substance Use Topics   Alcohol use: Yes    Comment: ocassionally   Drug use: No    Home Medications Prior to Admission medications   Medication Sig Start Date End Date Taking? Authorizing Provider  dicyclomine (BENTYL) 20 MG tablet Take 1 tablet (20 mg total) by mouth 3 (three) times daily before meals. 08/13/21  Yes Chamille Werntz, Canary Brim, MD  fluticasone (FLONASE) 50 MCG/ACT nasal spray Place 1 spray into both nostrils daily for 5 days. 03/14/20 03/19/20  Sabas Sous, MD  loratadine (CLARITIN) 10 MG tablet Take 1 tablet (10 mg total) by mouth daily. 03/14/20   Sabas Sous, MD    Allergies    Patient has no active allergies.  Review of Systems   Review of Systems  Gastrointestinal:  Positive for abdominal pain.  Genitourinary:  Positive for penile  discharge.  All other systems reviewed and are negative.  Physical Exam Updated Vital Signs BP (!) 151/98   Pulse 72   Temp 98.2 F (36.8 C)   Resp 18   Ht 6' (1.829 m)   Wt 117.9 kg   SpO2 100%   BMI 35.25 kg/m   Physical Exam Vitals and nursing note reviewed.  Constitutional:      General: He is not in acute distress.    Appearance: Normal appearance. He is well-developed.  HENT:     Head: Normocephalic and atraumatic.     Right Ear: Hearing normal.     Left Ear: Hearing normal.     Nose: Nose normal.  Eyes:     Conjunctiva/sclera: Conjunctivae normal.     Pupils: Pupils are equal, round, and reactive to light.  Cardiovascular:     Rate and Rhythm: Regular rhythm.     Heart sounds: S1 normal and S2 normal. No murmur heard.   No friction rub. No gallop.  Pulmonary:     Effort: Pulmonary effort is normal. No respiratory distress.     Breath sounds: Normal breath sounds.  Chest:     Chest wall: No tenderness.  Abdominal:     General: Bowel sounds are normal.     Palpations: Abdomen is soft.     Tenderness:  There is no abdominal tenderness. There is no guarding or rebound. Negative signs include Murphy's sign and McBurney's sign.     Hernia: No hernia is present.  Musculoskeletal:        General: Normal range of motion.     Cervical back: Normal range of motion and neck supple.  Skin:    General: Skin is warm and dry.     Findings: No rash.  Neurological:     Mental Status: He is alert and oriented to person, place, and time.     GCS: GCS eye subscore is 4. GCS verbal subscore is 5. GCS motor subscore is 6.     Cranial Nerves: No cranial nerve deficit.     Sensory: No sensory deficit.     Coordination: Coordination normal.  Psychiatric:        Speech: Speech normal.        Behavior: Behavior normal.        Thought Content: Thought content normal.    ED Results / Procedures / Treatments   Labs (all labs ordered are listed, but only abnormal results are  displayed) Labs Reviewed  URINALYSIS, ROUTINE W REFLEX MICROSCOPIC - Abnormal; Notable for the following components:      Result Value   Specific Gravity, Urine >1.030 (*)    Hgb urine dipstick TRACE (*)    Leukocytes,Ua SMALL (*)    All other components within normal limits  LIPASE, BLOOD  COMPREHENSIVE METABOLIC PANEL  CBC  URINALYSIS, MICROSCOPIC (REFLEX)  GC/CHLAMYDIA PROBE AMP ([place]) NOT AT [facility]    EKG None  Radiology DG ABD ACUTE 2+V W 1V CHEST  Result Date: 08/13/2021 CLINICAL DATA:  Initial evaluation for acute abdominal pain. EXAM: DG ABDOMEN ACUTE WITH 1 VIEW CHEST COMPARISON:  None. FINDINGS: Cardiac and mediastinal silhouettes within normal limits. Lungs well inflated. No focal infiltrates. No edema or effusion. No pneumothorax. Bowel gas pattern within normal limits without obstruction or ileus. No abnormal bowel wall thickening. No free air. No soft tissue mass or abnormal calcification. Visualized osseous structures within normal limits. IMPRESSION: 1. Nonobstructive bowel gas pattern with no radiographic evidence for acute intra-abdominal process. 2. No active cardiopulmonary disease. Electronically Signed   By: Rise Mu M.D.   On: 08/13/2021 00:55    Procedures Procedures   Medications Ordered in ED Medications - No data to display  ED Course  I have reviewed the triage vital signs and the nursing notes.  Pertinent labs & imaging results that were available during my care of the patient were reviewed by me and considered in my medical decision making (see chart for details).    MDM Rules/Calculators/A&P                           Patient with reassuring abdominal exam.  His pain is not currently present and there is no focal tenderness.  The palpation in all quadrants was benign.  Lab work is reassuring.  Etiology of pain is unclear but there does not appear to be an acute surgical emergency present based on this work-up.  X-ray is also normal,  no significant constipation, obstruction, etc.  We will treat with antispasmodics.  Patient with urethral discharge without urinary symptoms.  He is only seen the discharge once.  It was after urinating.  Etiology unclear.  GC and Chlamydia pending.  Urinalysis without acute findings.  Final Clinical Impression(s) / ED Diagnoses Final diagnoses:  Generalized abdominal pain  Rx / DC Orders ED Discharge Orders          Ordered    dicyclomine (BENTYL) 20 MG tablet  3 times daily before meals        08/13/21 0118             Gilda Crease, MD 08/13/21 [phone]

## 2021-08-14 ENCOUNTER — Encounter (HOSPITAL_COMMUNITY): Payer: Self-pay | Admitting: Emergency Medicine

## 2021-08-14 ENCOUNTER — Emergency Department (HOSPITAL_COMMUNITY)
Admission: EM | Admit: 2021-08-14 | Discharge: 2021-08-14 | Disposition: A | Discharge status: Home / Self Care | Payer: BC Managed Care – PPO | Attending: Emergency Medicine | Admitting: Emergency Medicine

## 2021-08-14 ENCOUNTER — Other Ambulatory Visit: Payer: Self-pay

## 2021-08-14 DIAGNOSIS — N342 Other urethritis: Secondary | ICD-10-CM | POA: Insufficient documentation

## 2021-08-14 DIAGNOSIS — R369 Urethral discharge, unspecified: Secondary | ICD-10-CM | POA: Diagnosis present

## 2021-08-14 MED ORDER — DOXYCYCLINE HYCLATE 100 MG PO CAPS: 100.0000 mg | ORAL_CAPSULE | Freq: Two times a day (BID) | ORAL | 0 refills | Status: AC

## 2021-08-14 MED ORDER — CEFTRIAXONE SODIUM 500 MG IJ SOLR
500.0000 mg | Freq: Once | INTRAMUSCULAR | Status: AC
  Administered 2021-08-14: 500 mg via INTRAMUSCULAR
  Filled 2021-08-14: qty 500

## 2021-08-14 MED ORDER — STERILE WATER FOR INJECTION IJ SOLN
INTRAMUSCULAR | Status: AC
  Administered 2021-08-14: 1 mL
  Filled 2021-08-14: qty 10

## 2021-08-14 NOTE — Discharge Instructions (Addendum)
You were evaluated in the Emergency Department and after careful evaluation, we did not find any emergent condition requiring admission or further testing in the hospital.  Your exam/testing today was overall reassuring.  Symptoms may be due to an STD such as gonorrhea or chlamydia.  We are treating you with antibiotics.  As discussed, please inform your sexual partners and avoid sexual contact while on the antibiotics.  Please return to the Emergency Department if you experience any worsening of your condition.  Thank you for allowing [place] to be a part of your care.

## 2021-08-14 NOTE — ED Provider Notes (Signed)
[facility]-EMERGENCY DEPT [facility] Emergency Department Provider Note MRN:  [id number]  Arrival date & time: 08/14/21     Chief Complaint   Penile Discharge   History of Present Illness   Jonathan Crawford is a 30 y.o. year-old male with no pertinent past medical history presenting to the ED with chief complaint of penile discharge.  White discharge from the penis this morning and yesterday.  Was evaluated recently in the emergency department with abdominal pain and had a reassuring work-up.  Abdominal pain is resolved.  Denies fever.  Symptoms are mild, intermittent, no other exacerbating or alleviating factors.  Has 2 sexual partners  Review of Systems  A problem-focused ROS was performed. Positive for penile discharge.  Patient denies fever.  Patient's Health History   History reviewed. No pertinent past medical history.  History reviewed. No pertinent surgical history.  No family history on file.  Social History   Socioeconomic History   Marital status: Single    Spouse name: Not on file   Number of children: Not on file   Years of education: Not on file   Highest education level: Not on file  Occupational History   Not on file  Tobacco Use   Smoking status: Never   Smokeless tobacco: Never  Vaping Use   Vaping Use: Never used  Substance and Sexual Activity   Alcohol use: Yes    Comment: ocassionally   Drug use: No   Sexual activity: Not on file  Other Topics Concern   Not on file  Social History Narrative   Not on file   Social Determinants of Health   Financial Resource Strain: Not on file  Food Insecurity: Not on file  Transportation Needs: Not on file  Physical Activity: Not on file  Stress: Not on file  Social Connections: Not on file  Intimate Partner Violence: Not on file     Physical Exam   Vitals:   08/14/21 0643 08/14/21 0652  BP:    Pulse: 99 87  Resp:  17  Temp:  98.3 F (36.8 C)  SpO2: 98% 99%    CONSTITUTIONAL: Well-appearing,  NAD NEURO:  Alert and oriented x 3, no focal deficits EYES:  eyes equal and reactive ENT/NECK:  no LAD, no JVD CARDIO: Regular rate, well-perfused, normal S1 and S2 PULM:  CTAB no wheezing or rhonchi GI/GU:  normal bowel sounds, non-distended, non-tender, normal-appearing external genitalia with no lymphadenopathy, no obvious penile discharge, no scrotal masses or tenderness MSK/SPINE:  No gross deformities, no edema SKIN:  no rash, atraumatic PSYCH:  Appropriate speech and behavior  *Additional and/or pertinent findings included in MDM below  Diagnostic and Interventional Summary    EKG Interpretation  Date/Time:    Ventricular Rate:    PR Interval:    QRS Duration:   QT Interval:    QTC Calculation:   R Axis:     Text Interpretation:         Labs Reviewed - No data to display  No orders to display    Medications  cefTRIAXone (ROCEPHIN) injection 500 mg (has no administration in time range)     Procedures  /  Critical Care Procedures  ED Course and Medical Decision Making  I have reviewed the triage vital signs, the nursing notes, and pertinent available records from the EMR.  Listed above are laboratory and imaging tests that I personally ordered, reviewed, and interpreted and then considered in my medical decision making (see below for details).  Continued dysuria and penile discharge, has a history of gonorrhea documented, for some reason the gonorrhea urine sample from recent ED visit was not collected or sent.  Given the continued symptoms we will just treat empirically.  No systemic symptoms, abdominal pain is resolved, appropriate for discharge.       Elmer Sow. Pilar Plate, MD [facility] Emergency Medicine [facility] [email or web address]  Final Clinical Impressions(s) / ED Diagnoses     ICD-10-CM   1. Urethritis  N34.2       ED Discharge Orders          Ordered    doxycycline (VIBRAMYCIN) 100 MG capsule  2 times daily        08/14/21  0650             Discharge Instructions Discussed with and Provided to Patient:    Discharge Instructions      You were evaluated in the Emergency Department and after careful evaluation, we did not find any emergent condition requiring admission or further testing in the hospital.  Your exam/testing today was overall reassuring.  Symptoms may be due to an STD such as gonorrhea or chlamydia.  We are treating you with antibiotics.  As discussed, please inform your sexual partners and avoid sexual contact while on the antibiotics.  Please return to the Emergency Department if you experience any worsening of your condition.  Thank you for allowing [place] to be a part of your care.        Sabas Sous, MD 08/14/21 [phone]

## 2021-08-14 NOTE — ED Triage Notes (Signed)
Pt c/o milky white penile discharge and dysuria.

## 2022-02-27 ENCOUNTER — Encounter: Payer: Self-pay | Admitting: Emergency Medicine

## 2022-02-27 ENCOUNTER — Ambulatory Visit
Admission: EM | Admit: 2022-02-27 | Discharge: 2022-02-27 | Disposition: A | Discharge status: Home / Self Care | Payer: BC Managed Care – PPO | Attending: Urgent Care | Admitting: Urgent Care

## 2022-02-27 ENCOUNTER — Other Ambulatory Visit: Payer: Self-pay

## 2022-02-27 DIAGNOSIS — J3489 Other specified disorders of nose and nasal sinuses: Secondary | ICD-10-CM

## 2022-02-27 DIAGNOSIS — H938X3 Other specified disorders of ear, bilateral: Secondary | ICD-10-CM | POA: Diagnosis not present

## 2022-02-27 DIAGNOSIS — R0981 Nasal congestion: Secondary | ICD-10-CM

## 2022-02-27 DIAGNOSIS — J019 Acute sinusitis, unspecified: Secondary | ICD-10-CM

## 2022-02-27 LAB — POCT RAPID STREP A (OFFICE): Rapid Strep A Screen: NEGATIVE

## 2022-02-27 MED ORDER — AMOXICILLIN 875 MG PO TABS: 875.0000 mg | ORAL_TABLET | Freq: Two times a day (BID) | ORAL | 0 refills | Status: AC

## 2022-02-27 MED ORDER — LEVOCETIRIZINE DIHYDROCHLORIDE 5 MG PO TABS: 5.0000 mg | ORAL_TABLET | Freq: Every evening | ORAL | 0 refills | Status: AC

## 2022-02-27 MED ORDER — PREDNISONE 20 MG PO TABS: ORAL_TABLET | ORAL | 0 refills | Status: AC

## 2022-02-27 NOTE — ED Triage Notes (Signed)
Nasal congestion since Sunday.  Has tried mucinex and nasal spray with out relief.   ?

## 2022-02-27 NOTE — ED Provider Notes (Signed)
?[facility] ? ? ?MRN: [id number] DOB: 08-16-1991 ? ?Subjective:  ? ?Jonathan Crawford is a 31 y.o. male presenting for 4 day history of acute onset sinus congestion, postnasal drainage, bilateral ear fullness and popping.  Has had mucus draining from both of his eyes.  No fevers, throat pain, chest pain, coughing.  Has a history of allergic rhinitis.  He does have kids at home but they are not sick.  Has been using his Flonase.  He is also started Mucinex without any relief. ? ?No current facility-administered medications for this encounter. ? ?Current Outpatient Medications:  ?  dicyclomine (BENTYL) 20 MG tablet, Take 1 tablet (20 mg total) by mouth 3 (three) times daily before meals., Disp: 30 tablet, Rfl: 0 ?  fluticasone (FLONASE) 50 MCG/ACT nasal spray, Place 1 spray into both nostrils daily for 5 days., Disp: 11.1 mL, Rfl: 2 ?  loratadine (CLARITIN) 10 MG tablet, Take 1 tablet (10 mg total) by mouth daily., Disp: 30 tablet, Rfl: 1  ? ?No Known Allergies ? ?History reviewed. No pertinent past medical history.  ? ?History reviewed. No pertinent surgical history. ? ?History reviewed. No pertinent family history. ? ?Social History  ? ?Tobacco Use  ? Smoking status: Never  ? Smokeless tobacco: Never  ?Vaping Use  ? Vaping Use: Never used  ?Substance Use Topics  ? Alcohol use: Yes  ?  Comment: ocassionally  ? Drug use: No  ? ? ?ROS ? ? ?Objective:  ? ?Vitals: ?BP (!) 153/92 (BP Location: Right Arm)   Temp 98.4 ?F (36.9 ?C) (Oral)   Resp 18   SpO2 96%  ? ?Physical Exam ?Constitutional:   ?   General: He is not in acute distress. ?   Appearance: Normal appearance. He is normal weight. He is not ill-appearing, toxic-appearing or diaphoretic.  ?HENT:  ?   Head: Normocephalic and atraumatic.  ?   Right Ear: Tympanic membrane, ear canal and external ear normal. There is no impacted cerumen.  ?   Left Ear: Tympanic membrane, ear canal and external ear normal. There is no impacted cerumen.  ?   Nose:  Congestion and rhinorrhea present.  ?   Mouth/Throat:  ?   Mouth: Mucous membranes are moist.  ?   Pharynx: Pharyngeal swelling, oropharyngeal exudate (right sided) and posterior oropharyngeal erythema present. No uvula swelling.  ?   Tonsils: Tonsillar exudate (right sided) present. 1+ on the right. 1+ on the left.  ?Eyes:  ?   General: No scleral icterus.    ?   Right eye: No discharge.     ?   Left eye: No discharge.  ?   Extraocular Movements: Extraocular movements intact.  ?   Conjunctiva/sclera: Conjunctivae normal.  ?Cardiovascular:  ?   Rate and Rhythm: Normal rate.  ?Pulmonary:  ?   Effort: Pulmonary effort is normal.  ?Musculoskeletal:  ?   Cervical back: Normal range of motion and neck supple. No rigidity. No muscular tenderness.  ?Neurological:  ?   General: No focal deficit present.  ?   Mental Status: He is alert and oriented to person, place, and time.  ?Psychiatric:     ?   Mood and Affect: Mood normal.     ?   Behavior: Behavior normal.  ? ?Results for orders placed or performed during the hospital encounter of 02/27/22 (from the past 24 hour(s))  ?POCT rapid strep A     Status: None  ? Collection Time: 02/27/22  2:20 PM  ?  Result Value Ref Range  ? Rapid Strep A Screen Negative Negative  ? ? ?Assessment and Plan :  ? ?PDMP not reviewed this encounter. ? ?1. Acute non-recurrent sinusitis, unspecified location   ?2. Sinus congestion   ?3. Sinus pressure   ?4. Ear fullness, bilateral   ? ?Will start empiric treatment for sinusitis with amoxicillin.  Recommended supportive care otherwise including the use of oral antihistamine, decongestant. Counseled patient on potential for adverse effects with medications prescribed/recommended today, ER and return-to-clinic precautions discussed, patient verbalized understanding. ? ?  ?Wallis Bamberg, PA-C ?02/27/22 1422 ? ?

## 2023-06-26 IMAGING — DX DG ABDOMEN ACUTE W/ 1V CHEST
4 series · 4 of 4 positions shown · non-contrast
Comparison: None.

CLINICAL DATA: Initial evaluation for acute abdominal pain.

EXAM:
DG ABDOMEN ACUTE WITH 1 VIEW CHEST

[abdomen supine grid (1 of 2)]
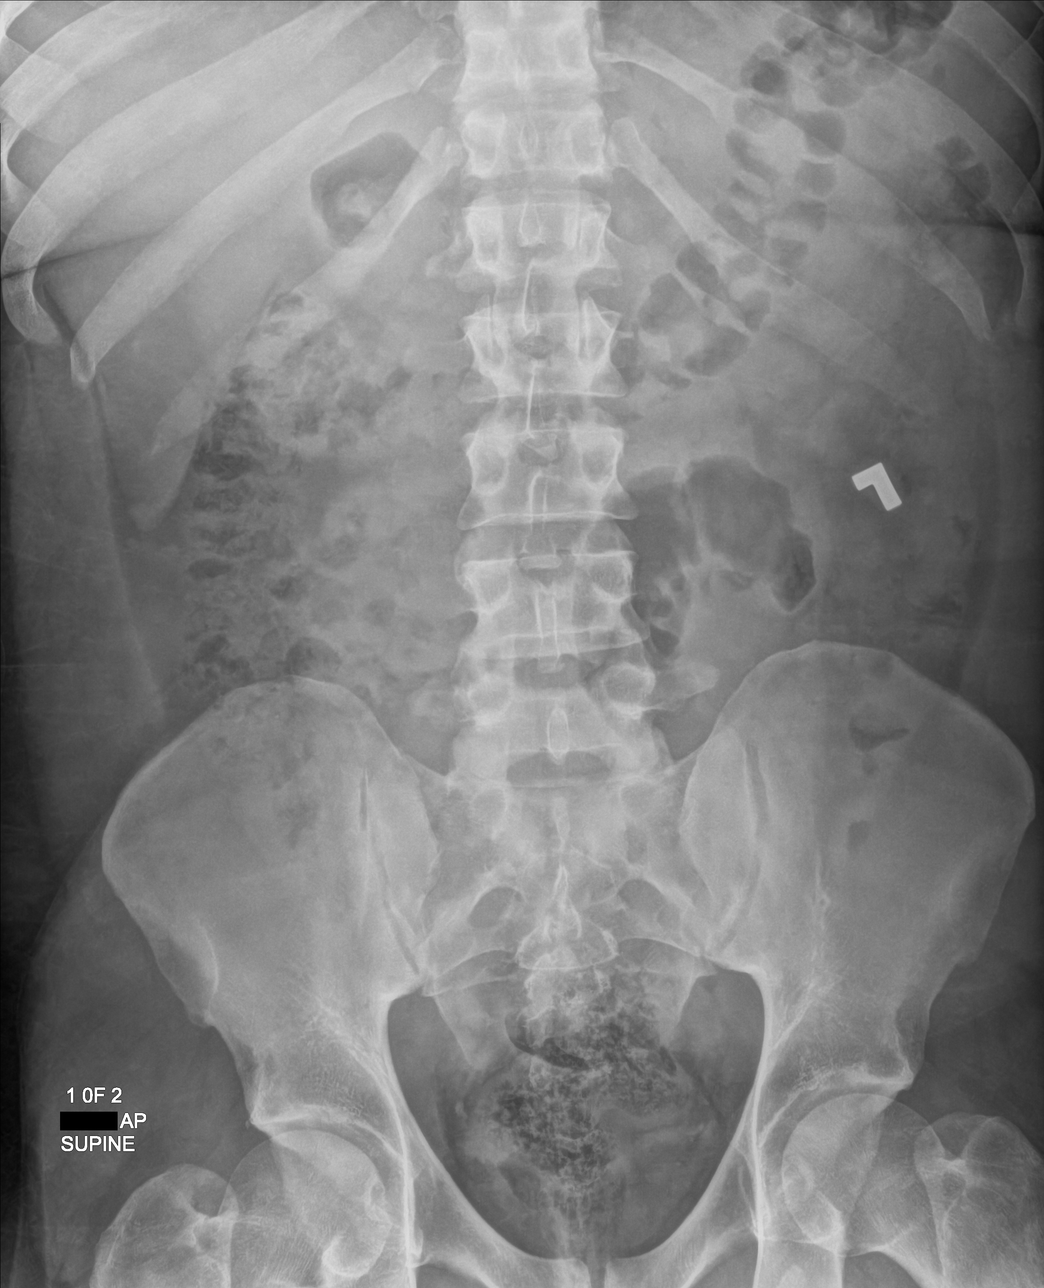

[chest ap grid]
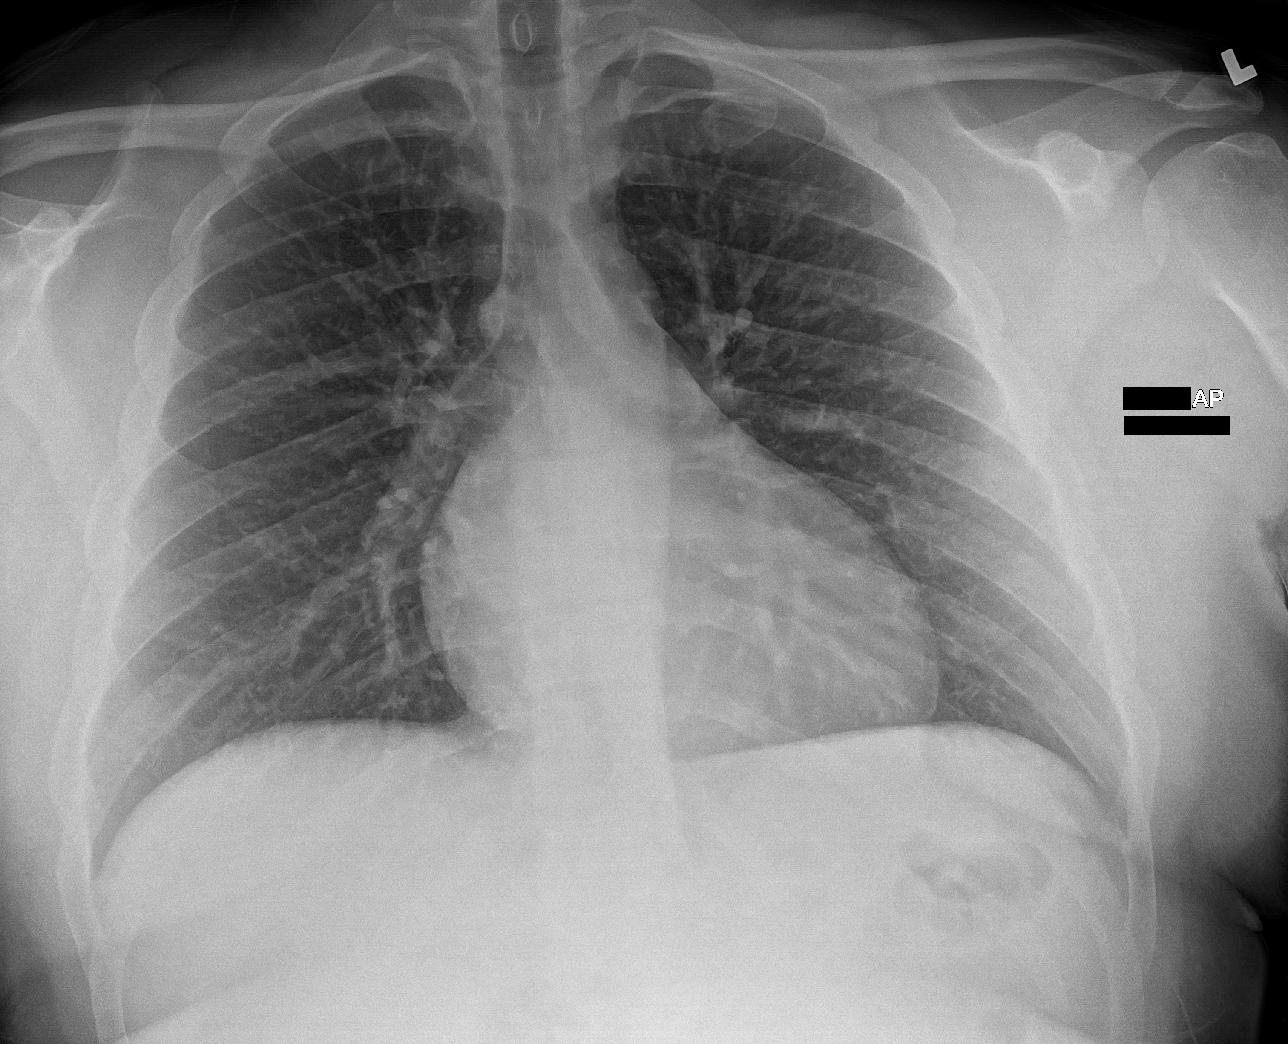

[abdomen erect grid]
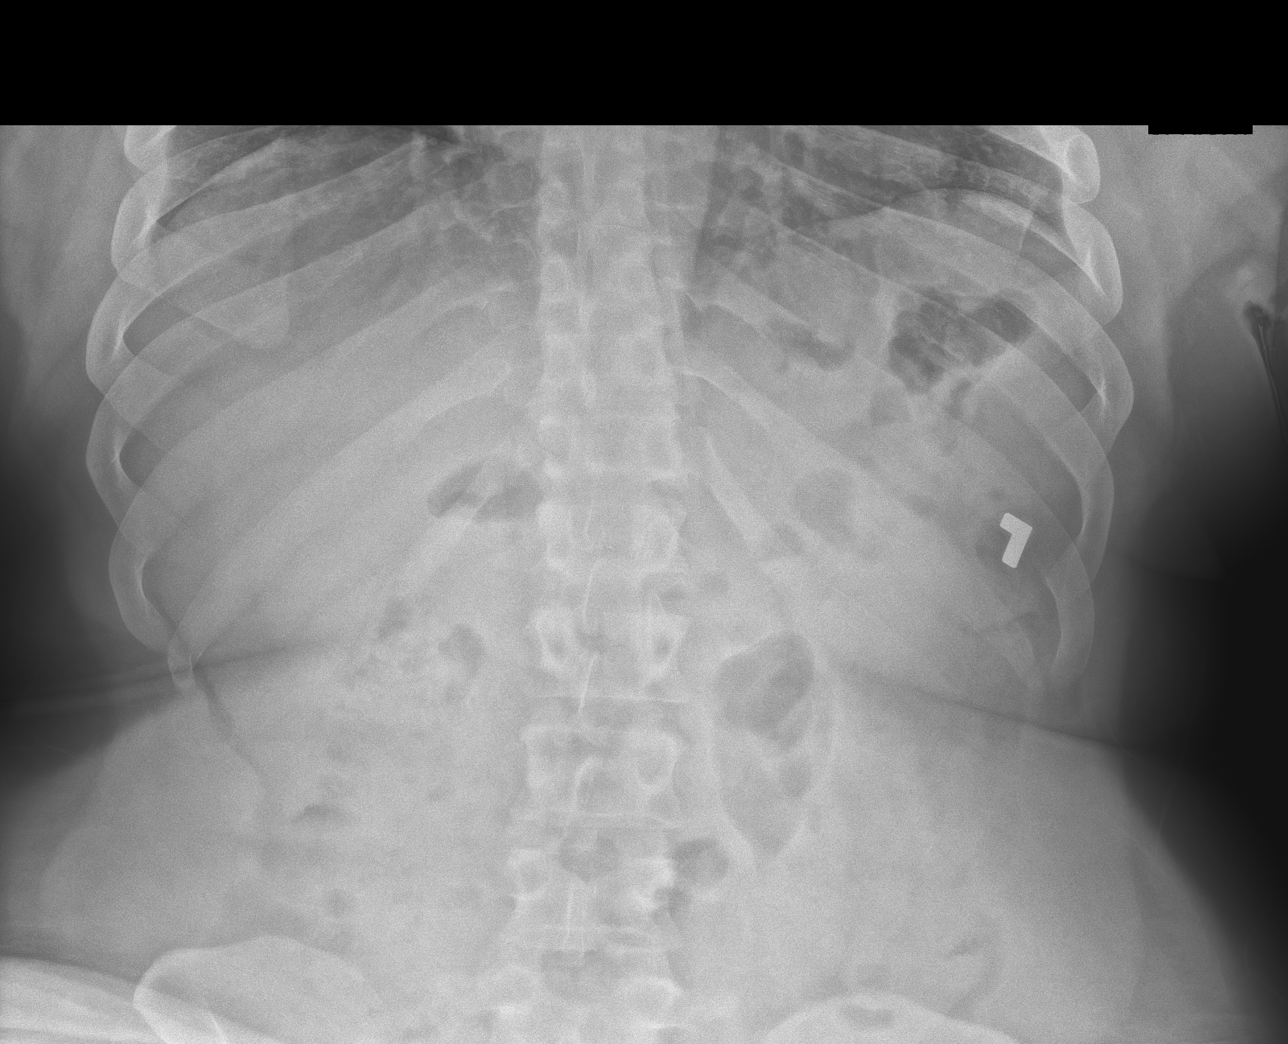

[abdomen supine grid (2 of 2)]
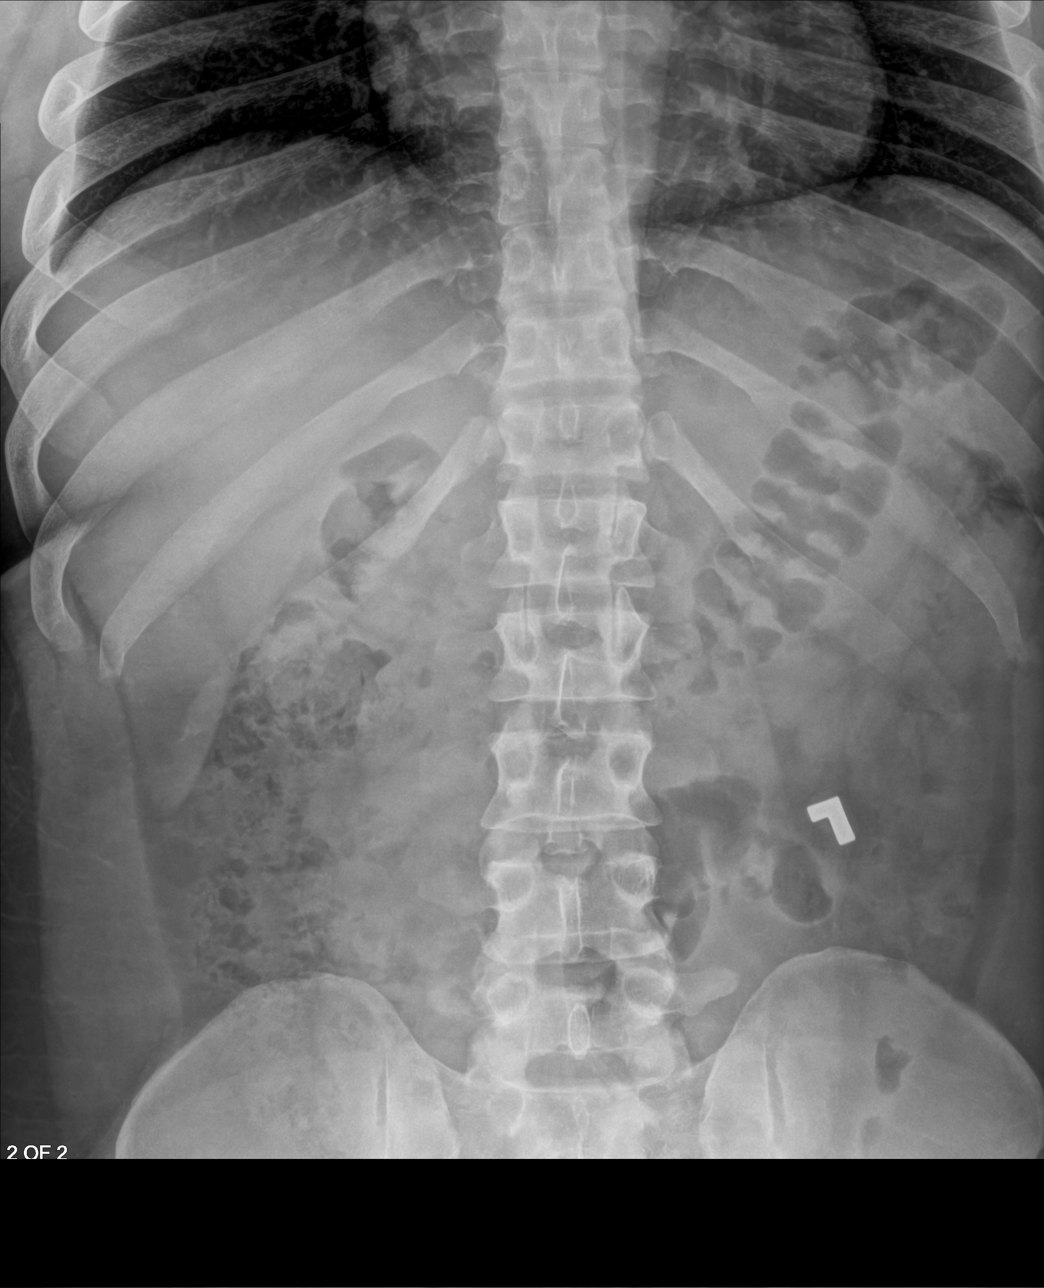

[4 of 4 positions shown; findings below may reference images not displayed]

FINDINGS: Cardiac and mediastinal silhouettes within normal limits.

Lungs well inflated. No focal infiltrates. No edema or effusion. No
pneumothorax.

Bowel gas pattern within normal limits without obstruction or ileus.
No abnormal bowel wall thickening. No free air. No soft tissue mass
or abnormal calcification.

Visualized osseous structures within normal limits.
IMPRESSION: 1. Nonobstructive bowel gas pattern with no radiographic evidence
for acute intra-abdominal process.
2. No active cardiopulmonary disease.

## 2024-05-22 ENCOUNTER — Other Ambulatory Visit: Payer: Self-pay

## 2024-05-22 ENCOUNTER — Emergency Department (HOSPITAL_COMMUNITY)
Admission: EM | Admit: 2024-05-22 | Discharge: 2024-05-22 | Discharge status: Against Medical Advice | Attending: Emergency Medicine | Admitting: Emergency Medicine

## 2024-05-22 ENCOUNTER — Encounter (HOSPITAL_COMMUNITY): Payer: Self-pay

## 2024-05-22 DIAGNOSIS — Z5321 Procedure and treatment not carried out due to patient leaving prior to being seen by health care provider: Secondary | ICD-10-CM | POA: Diagnosis not present

## 2024-05-22 DIAGNOSIS — M545 Low back pain, unspecified: Secondary | ICD-10-CM | POA: Diagnosis present

## 2024-05-22 NOTE — ED Triage Notes (Signed)
 Pt stated that his lower back started hurting 2 days ago. Can't recall any injury
